# Patient Record
Sex: Male | Born: 1985 | Race: White | Hispanic: No | Marital: Single | Smoking: Current some day smoker
Health system: Southern US, Community
[De-identification: ages and names within clinical notes are randomized; demographics above are authoritative.]

## PROBLEM LIST (undated history)

## (undated) DIAGNOSIS — J189 Pneumonia, unspecified organism: Secondary | ICD-10-CM

## (undated) DIAGNOSIS — T148XXA Other injury of unspecified body region, initial encounter: Secondary | ICD-10-CM

## (undated) DIAGNOSIS — J45909 Unspecified asthma, uncomplicated: Secondary | ICD-10-CM

## (undated) DIAGNOSIS — J3089 Other allergic rhinitis: Secondary | ICD-10-CM

## (undated) HISTORY — PX: WISDOM TOOTH EXTRACTION: SHX21

## (undated) HISTORY — PX: APPENDECTOMY: SHX54

## (undated) HISTORY — DX: Unspecified asthma, uncomplicated: J45.909

---

## 2008-02-20 ENCOUNTER — Ambulatory Visit: Payer: Self-pay | Admitting: Internal Medicine

## 2011-08-04 ENCOUNTER — Ambulatory Visit: Payer: Self-pay

## 2015-09-11 ENCOUNTER — Encounter: Payer: Self-pay | Admitting: Family Medicine

## 2015-09-11 ENCOUNTER — Ambulatory Visit (INDEPENDENT_AMBULATORY_CARE_PROVIDER_SITE_OTHER): Payer: BLUE CROSS/BLUE SHIELD | Admitting: Family Medicine

## 2015-09-11 VITALS — BP 120/70 | HR 60 | Temp 98.0°F | Ht 72.0 in | Wt 125.0 lb

## 2015-09-11 DIAGNOSIS — J0100 Acute maxillary sinusitis, unspecified: Secondary | ICD-10-CM

## 2015-09-11 DIAGNOSIS — J40 Bronchitis, not specified as acute or chronic: Secondary | ICD-10-CM | POA: Diagnosis not present

## 2015-09-11 LAB — POCT INFLUENZA A/B
Influenza A, POC: NEGATIVE
Influenza B, POC: NEGATIVE

## 2015-09-11 MED ORDER — AMOXICILLIN 500 MG PO CAPS: 500.0000 mg | ORAL_CAPSULE | Freq: Three times a day (TID) | ORAL | Status: DC

## 2015-09-11 MED ORDER — GUAIFENESIN-CODEINE 100-10 MG/5ML PO SOLN: 5.0000 mL | Freq: Three times a day (TID) | ORAL | Status: DC | PRN

## 2015-09-11 NOTE — Addendum Note (Signed)
Addended by: Duanne Limerick on: 09/11/2015 04:47 PM   Modules accepted: Orders

## 2015-09-11 NOTE — Progress Notes (Signed)
Name: Anthony Bowman   MRN: [id number]    DOB: 1986-02-24   Date:09/11/2015       Progress Note  Subjective  Chief Complaint  Chief Complaint  Patient presents with  . Sinusitis    cough, cong, fever, chills, night sweats, SOB    Sinusitis This is a new problem. The current episode started in the past 7 days. The problem has been waxing and waning since onset. The maximum temperature recorded prior to his arrival was 100.4 - 100.9 F. The fever has been present for 1 to 2 days. Associated symptoms include chills, congestion, coughing, diaphoresis, headaches, sinus pressure, sneezing and a sore throat. Pertinent negatives include no ear pain, neck pain, shortness of breath or swollen glands. Past treatments include nothing. The treatment provided mild relief.  Cough This is a new problem. The current episode started in the past 7 days. The problem has been gradually improving. The cough is productive of purulent sputum (yellow-green). Associated symptoms include chills, headaches and a sore throat. Pertinent negatives include no chest pain, ear pain, fever, heartburn, myalgias, rash, shortness of breath, weight loss or wheezing. The symptoms are aggravated by cold air. The treatment provided no relief. There is no history of environmental allergies.    No problem-specific assessment & plan notes found for this encounter.   Past Medical History  Diagnosis Date  . Asthma     History reviewed. No pertinent past surgical history.  History reviewed. No pertinent family history.  Social History   Social History  . Marital Status: Married    Spouse Name: N/A  . Number of Children: N/A  . Years of Education: N/A   Occupational History  . Not on file.   Social History Main Topics  . Smoking status: Current Every Day Smoker  . Smokeless tobacco: Not on file  . Alcohol Use: 0.0 oz/week    0 Standard drinks or equivalent per week  . Drug Use: No  . Sexual Activity: Yes   Other  Topics Concern  . Not on file   Social History Narrative  . No narrative on file    No Known Allergies   Review of Systems  Constitutional: Positive for chills and diaphoresis. Negative for fever, weight loss and malaise/fatigue.  HENT: Positive for congestion, sinus pressure, sneezing and sore throat. Negative for ear discharge and ear pain.   Eyes: Negative for blurred vision.  Respiratory: Positive for cough. Negative for sputum production, shortness of breath and wheezing.   Cardiovascular: Negative for chest pain, palpitations and leg swelling.  Gastrointestinal: Negative for heartburn, nausea, abdominal pain, diarrhea, constipation, blood in stool and melena.  Genitourinary: Negative for dysuria, urgency, frequency and hematuria.  Musculoskeletal: Negative for myalgias, back pain, joint pain and neck pain.  Skin: Negative for rash.  Neurological: Positive for headaches. Negative for dizziness, tingling, sensory change and focal weakness.  Endo/Heme/Allergies: Negative for environmental allergies and polydipsia. Does not bruise/bleed easily.  Psychiatric/Behavioral: Negative for depression and suicidal ideas. The patient is not nervous/anxious and does not have insomnia.      Objective  Filed Vitals:   09/11/15 1618  BP: 120/70  Pulse: 60  Temp: 98 F (36.7 C)  TempSrc: Oral  Height: 6' (1.829 m)  Weight: 125 lb (56.7 kg)    Physical Exam  Constitutional: He is oriented to person, place, and time and well-developed, well-nourished, and in no distress.  HENT:  Head: Normocephalic.  Right Ear: Tympanic membrane and external ear normal.  Left Ear: Tympanic membrane and external ear normal.  Nose: Nose normal.  Mouth/Throat: Oropharynx is clear and moist.  Eyes: Conjunctivae and EOM are normal. Pupils are equal, round, and reactive to light. Right eye exhibits no discharge. Left eye exhibits no discharge. No scleral icterus.  Neck: Normal range of motion. Neck supple.  No JVD present. No tracheal deviation present. No thyromegaly present.  Cardiovascular: Normal rate, regular rhythm, normal heart sounds and intact distal pulses.  Exam reveals no gallop and no friction rub.   No murmur heard. Pulmonary/Chest: Breath sounds normal. No respiratory distress. He has no wheezes. He has no rales.  Abdominal: Soft. Bowel sounds are normal. He exhibits no mass. There is no hepatosplenomegaly. There is no tenderness. There is no rebound, no guarding and no CVA tenderness.  Musculoskeletal: Normal range of motion. He exhibits no edema or tenderness.  Lymphadenopathy:    He has no cervical adenopathy.  Neurological: He is alert and oriented to person, place, and time. He has normal sensation, normal strength and intact cranial nerves. No cranial nerve deficit.  Skin: Skin is warm. No rash noted.  Psychiatric: Mood and affect normal.      Assessment & Plan  Problem List Items Addressed This Visit    None    Visit Diagnoses    Acute maxillary sinusitis, recurrence not specified    -  Primary    Relevant Medications    amoxicillin (AMOXIL) 500 MG capsule    guaiFENesin-codeine 100-10 MG/5ML syrup    Bronchitis        Relevant Medications    amoxicillin (AMOXIL) 500 MG capsule    guaiFENesin-codeine 100-10 MG/5ML syrup         Dr. Hayden [facility]  09/11/2015

## 2016-03-05 ENCOUNTER — Encounter: Payer: Self-pay | Admitting: *Deleted

## 2016-03-05 ENCOUNTER — Emergency Department
Admission: EM | Admit: 2016-03-05 | Discharge: 2016-03-05 | Disposition: A | Discharge status: Home / Self Care | Payer: Self-pay | Attending: Emergency Medicine | Admitting: Emergency Medicine

## 2016-03-05 DIAGNOSIS — J45909 Unspecified asthma, uncomplicated: Secondary | ICD-10-CM | POA: Insufficient documentation

## 2016-03-05 DIAGNOSIS — Y929 Unspecified place or not applicable: Secondary | ICD-10-CM | POA: Insufficient documentation

## 2016-03-05 DIAGNOSIS — Y93F2 Activity, caregiving, lifting: Secondary | ICD-10-CM | POA: Insufficient documentation

## 2016-03-05 DIAGNOSIS — X501XXA Overexertion from prolonged static or awkward postures, initial encounter: Secondary | ICD-10-CM | POA: Insufficient documentation

## 2016-03-05 DIAGNOSIS — X500XXA Overexertion from strenuous movement or load, initial encounter: Secondary | ICD-10-CM | POA: Insufficient documentation

## 2016-03-05 DIAGNOSIS — Y999 Unspecified external cause status: Secondary | ICD-10-CM | POA: Insufficient documentation

## 2016-03-05 DIAGNOSIS — Z792 Long term (current) use of antibiotics: Secondary | ICD-10-CM | POA: Insufficient documentation

## 2016-03-05 DIAGNOSIS — S39012A Strain of muscle, fascia and tendon of lower back, initial encounter: Secondary | ICD-10-CM | POA: Insufficient documentation

## 2016-03-05 DIAGNOSIS — F17200 Nicotine dependence, unspecified, uncomplicated: Secondary | ICD-10-CM | POA: Insufficient documentation

## 2016-03-05 MED ORDER — OXYCODONE-ACETAMINOPHEN 5-325 MG PO TABS
1.0000 | ORAL_TABLET | ORAL | Status: DC | PRN
  Administered 2016-03-05: 1 via ORAL

## 2016-03-05 MED ORDER — OXYCODONE-ACETAMINOPHEN 5-325 MG PO TABS: 1.0000 | ORAL_TABLET | Freq: Four times a day (QID) | ORAL | 0 refills | Status: DC | PRN

## 2016-03-05 MED ORDER — OXYCODONE-ACETAMINOPHEN 5-325 MG PO TABS
ORAL_TABLET | ORAL | Status: DC
Start: 2016-03-05 — End: 2016-03-05
  Filled 2016-03-05: qty 1

## 2016-03-05 MED ORDER — METHOCARBAMOL 500 MG PO TABS
1000.0000 mg | ORAL_TABLET | Freq: Once | ORAL | Status: AC
  Administered 2016-03-05: 1000 mg via ORAL
  Filled 2016-03-05: qty 2

## 2016-03-05 MED ORDER — METHOCARBAMOL 750 MG PO TABS: 750.0000 mg | ORAL_TABLET | Freq: Four times a day (QID) | ORAL | 0 refills | Status: DC

## 2016-03-05 NOTE — ED Triage Notes (Signed)
Pt c/o on-going back pain x 1 week, worse w/ bending and exertion. Pt denies relief from OTC meds and repositioning. Pt denies injury, but states he does some lifting and bending at work. Pt unable to go to work today due to pain this morning.

## 2016-03-05 NOTE — ED Provider Notes (Signed)
[facility] Emergency Department Provider Note   ____________________________________________   First MD Initiated Contact with Patient 03/05/16 1944     (approximate)  I have reviewed the triage vital signs and the nursing notes.   HISTORY  Chief Complaint Back Pain    HPI Anthony Bowman is a 30 y.o. male patient complaining of mid back pain for 1 week. Patient stated pain increases with bending and lateral movements. 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 Patient denies any radicular component to this pain. Patient denies any bladder or bowel dysfunction. Patient said over-the-counter anti-inflammatories are not helping. Patient rates his pain as a 7/10. Patient described the pain as "spasmatic and sharp".   Past Medical History:  Diagnosis Date  . Asthma     There are no active problems to display for this patient.   History reviewed. No pertinent surgical history.  Prior to Admission medications   Medication Sig Start Date End Date Taking? Authorizing Provider  albuterol (PROVENTIL HFA;VENTOLIN HFA) 108 (90 Base) MCG/ACT inhaler Inhale 2 puffs into the lungs every 6 (six) hours as needed for wheezing or shortness of breath.    Historical Provider, MD  amoxicillin (AMOXIL) 500 MG capsule Take 1 capsule (500 mg total) by mouth 3 (three) times daily. 09/11/15   Duanne Limerickeanna C Jones, MD  guaiFENesin-codeine 100-10 MG/5ML syrup Take 5 mLs by mouth 3 (three) times daily as needed. 09/11/15   Duanne Limerickeanna C Jones, MD    Allergies Review of patient's allergies indicates no known allergies.  History reviewed. No pertinent family history.  Social History Social History  Substance Use Topics  . Smoking status: Current Every Day Smoker  . Smokeless tobacco: Never Used  . Alcohol use 0.0 oz/week    Review of Systems Constitutional: No fever/chills Eyes: No visual changes. ENT: No sore throat. Cardiovascular: Denies chest  pain. Respiratory: Denies shortness of breath. Gastrointestinal: No abdominal pain.  No nausea, no vomiting.  No diarrhea.  No constipation. Genitourinary: Negative for dysuria. Musculoskeletal: Positive for back pain. Skin: Negative for rash. Neurological: Negative for headaches, focal weakness or numbness.    ____________________________________________   PHYSICAL EXAM:  VITAL SIGNS: ED Triage Vitals  Enc Vitals Group     BP 03/05/16 1921 (!) 140/103     Pulse Rate 03/05/16 1921 (!) 110     Resp 03/05/16 1921 18     Temp 03/05/16 1921 98.5 F (36.9 C)     Temp Source 03/05/16 1921 Oral     SpO2 03/05/16 1921 98 %     Weight 03/05/16 1922 145 lb (65.8 kg)     Height 03/05/16 1922 6' (1.829 m)     Head Circumference --      Peak Flow --      Pain Score 03/05/16 1922 7     Pain Loc --      Pain Edu? --      Excl. in GC? --     Constitutional: Alert and oriented. Well appearing and in no acute distress. Eyes: Conjunctivae are normal. PERRL. EOMI. Head: Atraumatic. Nose: No congestion/rhinnorhea. Mouth/Throat: Mucous membranes are moist.  Oropharynx non-erythematous. Neck: No stridor.  No cervical spine tenderness to palpation. Hematological/Lymphatic/Immunilogical: No cervical lymphadenopathy. Cardiovascular: Normal rate, regular rhythm. Grossly normal heart sounds.  Good peripheral circulation. Respiratory: Normal respiratory effort.  No retractions. Lungs CTAB. Gastrointestinal: Soft and nontender. No distention. No abdominal bruits. No CVA tenderness. Musculoskeletal: No obvious spinal deformity. Patient has some  moderate guarding with bilateral paraspinal muscles. Patient has increased muscle spasm with bilateral and flexion movements. Patient is a Straight leg test.  Neurologic:  Normal speech and language. No gross focal neurologic deficits are appreciated. No gait instability. Skin:  Skin is warm, dry and intact. No rash noted. Psychiatric: Mood and affect are  normal. Speech and behavior are normal.  ____________________________________________   LABS (all labs ordered are listed, but only abnormal results are displayed)  Labs Reviewed - No data to display ____________________________________________  EKG   ____________________________________________  RADIOLOGY   ____________________________________________   PROCEDURES  Procedure(s) performed: None  Procedures  Critical Care performed: No  ____________________________________________   INITIAL IMPRESSION / ASSESSMENT AND PLAN / ED COURSE  Pertinent labs & imaging results that were available during my care of the patient were reviewed by me and considered in my medical decision making (see chart for details). Lumbar strain. Patient given discharge Instructions. Patient given a prescription for Percocet and Robaxin. Patient given a work note for 2 days. Patient advised to follow-up  Clinical Course   Patient given Percocet at triage.  ____________________________________________   FINAL CLINICAL IMPRESSION(S) / ED DIAGNOSES  Final diagnoses:  Lumbar strain, initial encounter      NEW MEDICATIONS STARTED DURING THIS VISIT:  New Prescriptions   No medications on file     Note:  This document was prepared using Dragon voice recognition software and may include unintentional dictation errors.    Joni ReiningRonald K Johonna Binette, PA-C 03/05/16 1955    Sharman CheekPhillip Stafford, MD 03/05/16 [phone]

## 2016-03-05 NOTE — ED Notes (Signed)
Reviewed d/c instructions, follow-up care, use of ice with pt. Pt verbalized understanding.

## 2016-03-13 ENCOUNTER — Encounter: Payer: Self-pay | Admitting: Emergency Medicine

## 2016-03-13 ENCOUNTER — Emergency Department
Admission: EM | Admit: 2016-03-13 | Discharge: 2016-03-13 | Disposition: A | Discharge status: Home / Self Care | Payer: Self-pay | Attending: Emergency Medicine | Admitting: Emergency Medicine

## 2016-03-13 ENCOUNTER — Emergency Department: Payer: Self-pay

## 2016-03-13 DIAGNOSIS — Z792 Long term (current) use of antibiotics: Secondary | ICD-10-CM | POA: Insufficient documentation

## 2016-03-13 DIAGNOSIS — F17200 Nicotine dependence, unspecified, uncomplicated: Secondary | ICD-10-CM | POA: Insufficient documentation

## 2016-03-13 DIAGNOSIS — J45909 Unspecified asthma, uncomplicated: Secondary | ICD-10-CM | POA: Insufficient documentation

## 2016-03-13 DIAGNOSIS — M545 Low back pain, unspecified: Secondary | ICD-10-CM

## 2016-03-13 DIAGNOSIS — Z791 Long term (current) use of non-steroidal anti-inflammatories (NSAID): Secondary | ICD-10-CM | POA: Insufficient documentation

## 2016-03-13 DIAGNOSIS — Z79899 Other long term (current) drug therapy: Secondary | ICD-10-CM | POA: Insufficient documentation

## 2016-03-13 LAB — URINALYSIS COMPLETE WITH MICROSCOPIC (ARMC ONLY)
BACTERIA UA: NONE SEEN
Bilirubin Urine: NEGATIVE
Glucose, UA: NEGATIVE mg/dL
Hgb urine dipstick: NEGATIVE
KETONES UR: NEGATIVE mg/dL
Leukocytes, UA: NEGATIVE
Nitrite: NEGATIVE
PH: 5 (ref 5.0–8.0)
PROTEIN: 30 mg/dL — AB
RBC / HPF: NONE SEEN RBC/hpf (ref 0–5)
SQUAMOUS EPITHELIAL / LPF: NONE SEEN
Specific Gravity, Urine: 1.029 (ref 1.005–1.030)
WBC, UA: NONE SEEN WBC/hpf (ref 0–5)

## 2016-03-13 MED ORDER — NAPROXEN 500 MG PO TABS: 500.0000 mg | ORAL_TABLET | Freq: Two times a day (BID) | ORAL | 0 refills | Status: DC

## 2016-03-13 NOTE — ED Triage Notes (Signed)
Patient states that he was seen last week for back pain. Patient was told to follow up if the pain was not getting. Patient states that he was unable to get in with his doctor this week because of his work schedule. Patient states that the pain in his lower back is constant, has had a few episodes of numbness in the right leg. Patient states that his leg is not currently numb.

## 2016-03-13 NOTE — ED Provider Notes (Signed)
[facility] Emergency Department Provider Note   ____________________________________________   First MD Initiated Contact with Patient 03/13/16 1631     (approximate)  I have reviewed the triage vital signs and the nursing notes.   HISTORY  Chief Complaint Back Pain   HPI Anthony Bowman is a 30 y.o. male is here with complaint of low back pain. Pain is localized mostly on the right side. Patient was seen in the emergency room on 03/05/2016 at which time he was prescribed Percocet and Robaxin. Patient did not follow-up with his primary care doctor due to his work schedule. He is back again today with complaint of continued back pain. He denies any paresthesias into his legs. He denies any incontinence of bowel or bladder. He continues to be ambulatory without assistance. He states that his back has "never been x-rayed". He is unaware of any history of kidney stones. He denies any nausea or vomiting. Currently he rates his pain as a 6/10.   Past Medical History:  Diagnosis Date  . Asthma     There are no active problems to display for this patient.   History reviewed. No pertinent surgical history.  Prior to Admission medications   Medication Sig Start Date End Date Taking? Authorizing Provider  albuterol (PROVENTIL HFA;VENTOLIN HFA) 108 (90 Base) MCG/ACT inhaler Inhale 2 puffs into the lungs every 6 (six) hours as needed for wheezing or shortness of breath.    Historical Provider, MD  amoxicillin (AMOXIL) 500 MG capsule Take 1 capsule (500 mg total) by mouth 3 (three) times daily. 09/11/15   Duanne Limerickeanna C Jones, MD  guaiFENesin-codeine 100-10 MG/5ML syrup Take 5 mLs by mouth 3 (three) times daily as needed. 09/11/15   Duanne Limerickeanna C Jones, MD  methocarbamol (ROBAXIN-750) 750 MG tablet Take 1 tablet (750 mg total) by mouth 4 (four) times daily. 03/05/16   Joni Reiningonald K Smith, PA-C  naproxen (NAPROSYN) 500 MG tablet Take 1 tablet (500 mg total) by mouth 2 (two) times daily  with a meal. 03/13/16   Tommi Rumpshonda L Summers, PA-C  oxyCODONE-acetaminophen (ROXICET) 5-325 MG tablet Take 1 tablet by mouth every 6 (six) hours as needed for moderate pain. 03/05/16   Joni Reiningonald K Smith, PA-C    Allergies Review of patient's allergies indicates no known allergies.  No family history on file.  Social History Social History  Substance Use Topics  . Smoking status: Current Every Day Smoker  . Smokeless tobacco: Never Used  . Alcohol use 0.0 oz/week    Review of Systems Constitutional: No fever/chills Cardiovascular: Denies chest pain. Respiratory: Denies shortness of breath. Gastrointestinal: No abdominal pain.  No nausea, no vomiting.  No diarrhea.  No constipation. Genitourinary: Negative for dysuria. Musculoskeletal: Positive for back pain. Skin: Negative for rash. Neurological: Negative for headaches, focal weakness or numbness.  10-point ROS otherwise negative.  ____________________________________________   PHYSICAL EXAM:  VITAL SIGNS: ED Triage Vitals  Enc Vitals Group     BP 03/13/16 1626 128/87     Pulse Rate 03/13/16 1626 70     Resp 03/13/16 1626 16     Temp 03/13/16 1626 98.1 F (36.7 C)     Temp Source 03/13/16 1626 Oral     SpO2 03/13/16 1626 100 %     Weight 03/13/16 1627 150 lb (68 kg)     Height 03/13/16 1627 6' (1.829 m)     Head Circumference --      Peak Flow --  Pain Score 03/13/16 1625 6     Pain Loc --      Pain Edu? --      Excl. in GC? --     Constitutional: Alert and oriented. Well appearing and in no acute distress. Eyes: Conjunctivae are normal. PERRL. EOMI. Head: Atraumatic. Nose: No congestion/rhinnorhea. Neck: No stridor.   Cardiovascular: Normal rate, regular rhythm. Grossly normal heart sounds.  Good peripheral circulation. Respiratory: Normal respiratory effort.  No retractions. Lungs CTAB. Gastrointestinal: Soft and nontender. No distention. No CVA tenderness. Musculoskeletal: On examination of the back there is  no gross deformity. Patient has a very small skeletal frame. There is moderate tenderness on palpation of the right SI joint area as well as L5-S1 area. Range of motion is slightly restricted secondary discomfort but no actual muscle spasms were seen. Straight leg raises were approximately 70 bilaterally with minimal discomfort. Normal gait was noted. Neurologic:  Normal speech and language. No gross focal neurologic deficits are appreciated. No gait instability. Skin:  Skin is warm, dry and intact. No rash noted. Psychiatric: Mood and affect are normal. Speech and behavior are normal.  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   I, Tommi Rumps, personally viewed and evaluated these images (plain radiographs) as part of my medical decision making, as well as reviewing the written report by the radiologist. ____________________________________________   PROCEDURES  Procedure(s) performed: None  Procedures  Critical Care performed: No  ____________________________________________   INITIAL IMPRESSION / ASSESSMENT AND PLAN / ED COURSE  Pertinent labs & imaging results that were available during my care of the patient were reviewed by me and considered in my medical decision making (see chart for details).    Clinical Course   Patient was given information about his back x-ray. He was given a prescription for naproxen 5 mg twice a day with food. He is to follow-up with Dr. Martha Clan about his continued back  pain.  ____________________________________________   FINAL CLINICAL IMPRESSION(S) / ED DIAGNOSES  Final diagnoses:  Right-sided low back pain without sciatica      NEW MEDICATIONS STARTED DURING THIS VISIT:  New Prescriptions   NAPROXEN (NAPROSYN) 500 MG TABLET    Take 1 tablet (500 mg total) by mouth 2 (two) times daily with a meal.     Note:  This document was prepared using Dragon voice recognition software and may include unintentional dictation errors.    Tommi Rumps, PA-C 03/13/16 1806    Nita Sickle, MD 03/14/16 1134

## 2016-03-13 NOTE — Discharge Instructions (Signed)
Begin taking naproxen 500 mg twice a day with food. Call to make an appointment with Dr. Martha ClanKrasinski who is the orthopedist on call today. Ice or heat to you back as needed for inflammation and discomfort.

## 2016-04-18 ENCOUNTER — Emergency Department (HOSPITAL_COMMUNITY)
Admission: EM | Admit: 2016-04-18 | Discharge: 2016-04-19 | Disposition: A | Discharge status: Home / Self Care | Payer: Self-pay | Attending: Emergency Medicine | Admitting: Emergency Medicine

## 2016-04-18 ENCOUNTER — Emergency Department (HOSPITAL_COMMUNITY): Payer: Self-pay

## 2016-04-18 ENCOUNTER — Encounter (HOSPITAL_COMMUNITY): Payer: Self-pay | Admitting: Emergency Medicine

## 2016-04-18 DIAGNOSIS — J45909 Unspecified asthma, uncomplicated: Secondary | ICD-10-CM | POA: Insufficient documentation

## 2016-04-18 DIAGNOSIS — Y939 Activity, unspecified: Secondary | ICD-10-CM | POA: Insufficient documentation

## 2016-04-18 DIAGNOSIS — S56921A Laceration of unspecified muscles, fascia and tendons at forearm level, right arm, initial encounter: Secondary | ICD-10-CM

## 2016-04-18 DIAGNOSIS — Y929 Unspecified place or not applicable: Secondary | ICD-10-CM | POA: Insufficient documentation

## 2016-04-18 DIAGNOSIS — S61421A Laceration with foreign body of right hand, initial encounter: Secondary | ICD-10-CM | POA: Insufficient documentation

## 2016-04-18 DIAGNOSIS — Z87891 Personal history of nicotine dependence: Secondary | ICD-10-CM | POA: Insufficient documentation

## 2016-04-18 DIAGNOSIS — Y999 Unspecified external cause status: Secondary | ICD-10-CM | POA: Insufficient documentation

## 2016-04-18 DIAGNOSIS — S51811A Laceration without foreign body of right forearm, initial encounter: Secondary | ICD-10-CM | POA: Insufficient documentation

## 2016-04-18 DIAGNOSIS — W25XXXA Contact with sharp glass, initial encounter: Secondary | ICD-10-CM | POA: Insufficient documentation

## 2016-04-18 MED ORDER — MORPHINE SULFATE (PF) 4 MG/ML IV SOLN
8.0000 mg | Freq: Once | INTRAVENOUS | Status: AC
Start: 2016-04-18 — End: 2016-04-18
  Administered 2016-04-18: 8 mg via INTRAVENOUS
  Filled 2016-04-18: qty 2

## 2016-04-18 MED ORDER — MORPHINE SULFATE (PF) 4 MG/ML IV SOLN
8.0000 mg | Freq: Once | INTRAVENOUS | Status: AC
  Administered 2016-04-19: 8 mg via INTRAVENOUS
  Filled 2016-04-18: qty 2

## 2016-04-18 MED ORDER — LIDOCAINE-EPINEPHRINE (PF) 2 %-1:200000 IJ SOLN
20.0000 mL | Freq: Once | INTRAMUSCULAR | Status: DC
  Filled 2016-04-18: qty 20

## 2016-04-18 NOTE — ED Provider Notes (Signed)
[facility] DEPT Provider Note   CSN: [id number] Arrival date & time: 04/18/16  2219     History   Chief Complaint Chief Complaint  Patient presents with  . Extremity Laceration    HPI Anthony Bowman is a 30 y.o. male.   Laceration   The incident occurred less than 1 hour ago. The laceration is located on the right hand and right arm. Size: 2cm near elbow, 3 cm to midarm, 5 cm to right hand. The laceration mechanism was a broken glass. The pain is moderate. The pain has been constant since onset. Possible foreign bodies include glass. His tetanus status is UTD.    Past Medical History:  Diagnosis Date  . Asthma     There are no active problems to display for this patient.   History reviewed. No pertinent surgical history.     Home Medications    Prior to Admission medications   Medication Sig Start Date End Date Taking? Authorizing Provider  albuterol (PROVENTIL HFA;VENTOLIN HFA) 108 (90 Base) MCG/ACT inhaler Inhale 2 puffs into the lungs every 6 (six) hours as needed for wheezing or shortness of breath.    Historical Provider, MD  amoxicillin (AMOXIL) 500 MG capsule Take 1 capsule (500 mg total) by mouth 3 (three) times daily. 09/11/15   Duanne Limerickeanna C Jones, MD  guaiFENesin-codeine 100-10 MG/5ML syrup Take 5 mLs by mouth 3 (three) times daily as needed. 09/11/15   Duanne Limerickeanna C Jones, MD  methocarbamol (ROBAXIN-750) 750 MG tablet Take 1 tablet (750 mg total) by mouth 4 (four) times daily. 03/05/16   Joni Reiningonald K Smith, PA-C  naproxen (NAPROSYN) 500 MG tablet Take 1 tablet (500 mg total) by mouth 2 (two) times daily with a meal. 03/13/16   Tommi Rumpshonda L Summers, PA-C  oxyCODONE-acetaminophen (ROXICET) 5-325 MG tablet Take 1 tablet by mouth every 6 (six) hours as needed for moderate pain. 03/05/16   Joni Reiningonald K Smith, PA-C    Family History History reviewed. No pertinent family history.  Social History Social History  Substance Use Topics  . Smoking status: Former Smoker    Quit date:  03/19/2016  . Smokeless tobacco: Never Used  . Alcohol use 0.0 oz/week     Allergies   Review of patient's allergies indicates no known allergies.   Review of Systems Review of Systems  Constitutional: Negative for chills and fever.  HENT: Negative for ear pain and sore throat.   Eyes: Negative for pain and visual disturbance.  Respiratory: Negative for cough and shortness of breath.   Cardiovascular: Negative for chest pain and palpitations.  Gastrointestinal: Negative for abdominal pain and vomiting.  Genitourinary: Negative for dysuria and hematuria.  Musculoskeletal: Negative for arthralgias and back pain.  Skin: Positive for wound. Negative for color change and rash.  Neurological: Negative for seizures and syncope.  All other systems reviewed and are negative.    Physical Exam Updated Vital Signs BP (!) 143/101 (BP Location: Right Arm)   Pulse 89   Temp 98.8 F (37.1 C) (Oral)   Resp 22   Ht 6' (1.829 m)   Wt 68 kg   SpO2 100%   BMI 20.34 kg/m   Physical Exam  Constitutional: He is oriented to person, place, and time. He appears well-developed and well-nourished.  HENT:  Head: Normocephalic and atraumatic.  Eyes: Conjunctivae and EOM are normal. Pupils are equal, round, and reactive to light.  Neck: Normal range of motion. Neck supple.  Cardiovascular: Normal rate and regular rhythm.  Pulmonary/Chest: Effort normal and breath sounds normal.  Abdominal: Soft. There is no tenderness.  Neurological: He is alert and oriented to person, place, and time.  Skin: Skin is warm and dry.  Right arm and hand lacerations: 2cm near elbow, 3 cm to midarm, 5 cm to right hand. Neurovascularly intact.  Able to move digits.  Psychiatric: He has a normal mood and affect.  Nursing note and vitals reviewed.    ED Treatments / Results  Labs (all labs ordered are listed, but only abnormal results are displayed) Labs Reviewed - No data to display  EKG  EKG  Interpretation None       Radiology No results found.  Procedures .Marland KitchenLaceration Repair Date/Time: 04/19/2016 2:09 AM Performed by: Garey Ham Authorized by: Melene Plan   Consent:    Consent obtained:  Verbal   Consent given by:  Patient   Risks discussed:  Pain, tendon damage, vascular damage, nerve damage and need for additional repair   Alternatives discussed:  Delayed treatment and no treatment Anesthesia (see MAR for exact dosages):    Anesthesia method:  Local infiltration   Local anesthetic:  Lidocaine 2% WITH epi Laceration details:    Location:  Hand   Hand location:  R hand, dorsum   Length (cm):  6   Depth (mm):  15 Repair type:    Repair type:  Complex Pre-procedure details:    Preparation:  Imaging obtained to evaluate for foreign bodies and patient was prepped and draped in usual sterile fashion Exploration:    Limited defect created (wound extended): no     Hemostasis achieved with:  Tied off vessels, tourniquet and direct pressure   Wound exploration: entire depth of wound probed and visualized     Wound extent: foreign bodies/material (glass removed), tendon damage (partial to 5th digit) and vascular damage     Tendon damage location:  Upper extremity   Upper extremity tendon damage location:  Finger extensor   Finger extensor tendon:  Extensor digiti minimi   Tendon damage extent:  Partial transection   Tendon repair plan:  Refer for evaluation Treatment:    Wound cleansed with: chloraprep.   Amount of cleaning:  Extensive   Irrigation solution:  Sterile saline   Irrigation volume:  1L   Irrigation method:  Syringe   Visualized foreign bodies/material removed: yes     Debridement:  Minimal   Undermining:  None   Scar revision: no   Skin repair:    Repair method:  Sutures   Suture size:  4-0   Suture material:  Prolene   Suture technique:  Simple interrupted   Number of sutures:  27 Approximation:    Approximation:  Close Post-procedure  details:    Dressing:  Splint for protection and bulky dressing   Patient tolerance of procedure:  Tolerated well, no immediate complications    (including critical care time)  Medications Ordered in ED Medications  lidocaine-EPINEPHrine (XYLOCAINE W/EPI) 2 %-1:200000 (PF) injection 20 mL (not administered)  morphine 4 MG/ML injection 8 mg (8 mg Intravenous Given 04/18/16 2240)  morphine 4 MG/ML injection 8 mg (8 mg Intravenous Given 04/19/16 0002)     Initial Impression / Assessment and Plan / ED Course  I have reviewed the triage vital signs and the nursing notes.  Pertinent labs & imaging results that were available during my care of the patient were reviewed by me and considered in my medical decision making (see chart for details).  Clinical Course  IV medications given for pain control.  Patient lacerations repairs as above.  Arterial bleeding encountered and vessel ligated. Tourniquet applied.  Concern for partial extensor tendon injury.  Patient referred to hand surgery for further evaluation.  Patient discharged with return precautions, follow up instructions, and educational materials.  After the patient was discharged, he was contacted at home for follow up and to coordinate a prescription for Keflex.  He will pick up a phone-in prescription to the [facility] in [place].  Kefflex 500mg  QID for 7 days.  Final Clinical Impressions(s) / ED Diagnoses   Final diagnoses:  Laceration of right hand with foreign body, initial encounter  Forearm laceration involving tendon, right, initial encounter    New Prescriptions New Prescriptions   HYDROCODONE-ACETAMINOPHEN (NORCO/VICODIN) 5-325 MG TABLET    Take 1-2 tablets by mouth every 4 (four) hours as needed.     Garey Ham, MD 04/19/16 1478    Garey Ham, MD 04/19/16 1111    Melene Plan, DO 04/19/16 2052

## 2016-04-18 NOTE — ED Triage Notes (Signed)
Pt presents from home with [facility] for multiple lacerations to RIGHT hand and forearm; pt states he was attempting to prevent a glass door from closing and arm went through glass; pt became hypotensive upon sight of blood (BPs were 88/50 and 90/60); EMS gave 500cc NS and BP now 143/93; pt reports injury occurred approx 2130 tonight; pt says pain is throbbing, tingling, burning pain

## 2016-04-18 NOTE — ED Notes (Signed)
Pt transported to xray 

## 2016-04-19 MED ORDER — HYDROCODONE-ACETAMINOPHEN 5-325 MG PO TABS: 1.0000 | ORAL_TABLET | ORAL | 0 refills | Status: DC | PRN

## 2016-04-19 MED ORDER — HYDROCODONE-ACETAMINOPHEN 5-325 MG PO TABS
2.0000 | ORAL_TABLET | Freq: Once | ORAL | Status: AC
  Administered 2016-04-19: 2 via ORAL
  Filled 2016-04-19: qty 2

## 2016-04-19 NOTE — ED Notes (Signed)
Patient verbalized understanding of discharge instructions and denies any further needs or questions at this time. VS stable. Patient ambulatory with steady gait, escorted to ED entrance in wheelchair.   

## 2016-04-19 NOTE — Progress Notes (Signed)
[facility]'s girlfriend Victorino DikeJennifer reporting that antibiotic was supposed to be called into [facility] on [address] [place], however, it was not there.  Patient's girlfriend reports she would also like to know if patient can loosen up bandage as it is hurting him.  EDCM spoke to Resident Jaquita RectorMichael Dean who reports he has called a prescription for Keflex 500mg  1 po qid for 7 days this am for patient.  He also reports it is okay for patient to loosen his bandage.  EDCM called [facility] and [facility] who reports prescription was left on the voice mail and is currently filled and ready for pick up.  EDCM called and informed patient's girlfriend Victorino DikeJennifer of the above information.

## 2016-04-27 ENCOUNTER — Encounter (HOSPITAL_COMMUNITY): Payer: Self-pay | Admitting: *Deleted

## 2016-04-27 NOTE — Progress Notes (Signed)
Pt denies SOB, chest pain, and being under the care of a cardiologist. Pt denies having a stress test, echo and cardiac cath. Pt denies having a chest x ray and EKG within the last year. Pt denies having any recent labs. Pt made aware to stop taking Aspirin, vitamins, fish oil and herbal medications. Do not take any NSAIDs ie: Ibuprofen, Advil, Naproxen, BC and Goody Powder or any medication containing Aspirin. Pt verbalized understanding of all pre-op instructions.

## 2016-04-28 ENCOUNTER — Encounter (HOSPITAL_COMMUNITY)
Admission: RE | Disposition: A | Discharge status: Home / Self Care | Payer: Self-pay | Source: Ambulatory Visit | Attending: Orthopedic Surgery

## 2016-04-28 ENCOUNTER — Ambulatory Visit (HOSPITAL_COMMUNITY): Payer: Self-pay | Admitting: Certified Registered Nurse Anesthetist

## 2016-04-28 ENCOUNTER — Ambulatory Visit (HOSPITAL_COMMUNITY)
Admission: RE | Admit: 2016-04-28 | Discharge: 2016-04-28 | Disposition: A | Discharge status: Home / Self Care | Payer: Self-pay | Source: Ambulatory Visit | Attending: Orthopedic Surgery | Admitting: Orthopedic Surgery

## 2016-04-28 ENCOUNTER — Encounter (HOSPITAL_COMMUNITY): Payer: Self-pay | Admitting: General Practice

## 2016-04-28 DIAGNOSIS — S66526A Laceration of intrinsic muscle, fascia and tendon of right little finger at wrist and hand level, initial encounter: Secondary | ICD-10-CM | POA: Insufficient documentation

## 2016-04-28 DIAGNOSIS — W458XXA Other foreign body or object entering through skin, initial encounter: Secondary | ICD-10-CM | POA: Insufficient documentation

## 2016-04-28 DIAGNOSIS — W25XXXA Contact with sharp glass, initial encounter: Secondary | ICD-10-CM | POA: Insufficient documentation

## 2016-04-28 DIAGNOSIS — Z87891 Personal history of nicotine dependence: Secondary | ICD-10-CM | POA: Insufficient documentation

## 2016-04-28 DIAGNOSIS — Z1881 Retained glass fragments: Secondary | ICD-10-CM | POA: Insufficient documentation

## 2016-04-28 DIAGNOSIS — J45909 Unspecified asthma, uncomplicated: Secondary | ICD-10-CM | POA: Insufficient documentation

## 2016-04-28 DIAGNOSIS — S61421A Laceration with foreign body of right hand, initial encounter: Secondary | ICD-10-CM | POA: Insufficient documentation

## 2016-04-28 DIAGNOSIS — S51821A Laceration with foreign body of right forearm, initial encounter: Secondary | ICD-10-CM | POA: Insufficient documentation

## 2016-04-28 DIAGNOSIS — Z792 Long term (current) use of antibiotics: Secondary | ICD-10-CM | POA: Insufficient documentation

## 2016-04-28 HISTORY — DX: Pneumonia, unspecified organism: J18.9

## 2016-04-28 HISTORY — PX: WOUND EXPLORATION: SHX6188

## 2016-04-28 HISTORY — DX: Other allergic rhinitis: J30.89

## 2016-04-28 HISTORY — DX: Other injury of unspecified body region, initial encounter: T14.8XXA

## 2016-04-28 LAB — CBC
HCT: 37.3 % — ABNORMAL LOW (ref 39.0–52.0)
Hemoglobin: 12.8 g/dL — ABNORMAL LOW (ref 13.0–17.0)
MCH: 30.5 pg (ref 26.0–34.0)
MCHC: 34.3 g/dL (ref 30.0–36.0)
MCV: 88.8 fL (ref 78.0–100.0)
Platelets: 247 10*3/uL (ref 150–400)
RBC: 4.2 MIL/uL — ABNORMAL LOW (ref 4.22–5.81)
RDW: 12.9 % (ref 11.5–15.5)
WBC: 7.2 10*3/uL (ref 4.0–10.5)

## 2016-04-28 SURGERY — WOUND EXPLORATION
Anesthesia: General | Laterality: Right

## 2016-04-28 MED ORDER — OXYCODONE HCL 5 MG/5ML PO SOLN: 5.0000 mg | Freq: Once | ORAL | Status: AC | PRN

## 2016-04-28 MED ORDER — OXYCODONE-ACETAMINOPHEN 5-325 MG PO TABS
ORAL_TABLET | ORAL | Status: AC
  Filled 2016-04-28: qty 1

## 2016-04-28 MED ORDER — FENTANYL CITRATE (PF) 100 MCG/2ML IJ SOLN
INTRAMUSCULAR | Status: AC
  Filled 2016-04-28: qty 2

## 2016-04-28 MED ORDER — LACTATED RINGERS IV SOLN
INTRAVENOUS | Status: DC
  Administered 2016-04-28 (×3): via INTRAVENOUS

## 2016-04-28 MED ORDER — BUPIVACAINE HCL (PF) 0.25 % IJ SOLN
INTRAMUSCULAR | Status: AC
Start: 2016-04-28 — End: 2016-04-28
  Filled 2016-04-28: qty 30

## 2016-04-28 MED ORDER — OXYCODONE HCL 5 MG PO TABS
5.0000 mg | ORAL_TABLET | Freq: Once | ORAL | Status: AC | PRN
  Administered 2016-04-28: 5 mg via ORAL

## 2016-04-28 MED ORDER — DEXAMETHASONE SODIUM PHOSPHATE 10 MG/ML IJ SOLN
INTRAMUSCULAR | Status: AC
  Filled 2016-04-28: qty 1

## 2016-04-28 MED ORDER — HYDROMORPHONE HCL 1 MG/ML IJ SOLN: 0.2500 mg | INTRAMUSCULAR | Status: DC | PRN

## 2016-04-28 MED ORDER — BUPIVACAINE HCL (PF) 0.25 % IJ SOLN
INTRAMUSCULAR | Status: AC
  Filled 2016-04-28: qty 30

## 2016-04-28 MED ORDER — DOCUSATE SODIUM 100 MG PO CAPS: 100.0000 mg | ORAL_CAPSULE | Freq: Two times a day (BID) | ORAL | 0 refills | Status: DC

## 2016-04-28 MED ORDER — FENTANYL CITRATE (PF) 100 MCG/2ML IJ SOLN
INTRAMUSCULAR | Status: AC
Start: 2016-04-28 — End: 2016-04-28
  Filled 2016-04-28: qty 2

## 2016-04-28 MED ORDER — FENTANYL CITRATE (PF) 100 MCG/2ML IJ SOLN
INTRAMUSCULAR | Status: DC | PRN
  Administered 2016-04-28 (×4): 25 ug via INTRAVENOUS

## 2016-04-28 MED ORDER — OXYCODONE-ACETAMINOPHEN 5-325 MG PO TABS
1.0000 | ORAL_TABLET | ORAL | Status: DC | PRN
  Administered 2016-04-28: 1 via ORAL

## 2016-04-28 MED ORDER — 0.9 % SODIUM CHLORIDE (POUR BTL) OPTIME
TOPICAL | Status: DC | PRN
  Administered 2016-04-28: 1000 mL

## 2016-04-28 MED ORDER — OXYCODONE HCL 5 MG PO TABS
ORAL_TABLET | ORAL | Status: AC
  Filled 2016-04-28: qty 1

## 2016-04-28 MED ORDER — BUPIVACAINE-EPINEPHRINE 0.25% -1:200000 IJ SOLN: INTRAMUSCULAR | Status: DC | PRN

## 2016-04-28 MED ORDER — DEXAMETHASONE SODIUM PHOSPHATE 10 MG/ML IJ SOLN
INTRAMUSCULAR | Status: DC | PRN
Start: 2016-04-28 — End: 2016-04-28
  Administered 2016-04-28: 10 mg via INTRAVENOUS

## 2016-04-28 MED ORDER — PROPOFOL 10 MG/ML IV BOLUS
INTRAVENOUS | Status: AC
  Filled 2016-04-28: qty 20

## 2016-04-28 MED ORDER — CEFAZOLIN SODIUM-DEXTROSE 2-4 GM/100ML-% IV SOLN
2.0000 g | INTRAVENOUS | Status: AC
  Administered 2016-04-28: 2 g via INTRAVENOUS
  Filled 2016-04-28: qty 100

## 2016-04-28 MED ORDER — PROPOFOL 10 MG/ML IV BOLUS
INTRAVENOUS | Status: DC | PRN
  Administered 2016-04-28: 170 mg via INTRAVENOUS
  Administered 2016-04-28: 200 mg via INTRAVENOUS

## 2016-04-28 MED ORDER — MIDAZOLAM HCL 2 MG/2ML IJ SOLN
INTRAMUSCULAR | Status: AC
  Filled 2016-04-28: qty 2

## 2016-04-28 MED ORDER — CHLORHEXIDINE GLUCONATE 4 % EX LIQD: 60.0000 mL | Freq: Once | CUTANEOUS | Status: DC

## 2016-04-28 MED ORDER — MIDAZOLAM HCL 2 MG/2ML IJ SOLN
INTRAMUSCULAR | Status: DC | PRN
  Administered 2016-04-28: 2 mg via INTRAVENOUS

## 2016-04-28 MED ORDER — ONDANSETRON HCL 4 MG/2ML IJ SOLN
INTRAMUSCULAR | Status: AC
  Filled 2016-04-28: qty 2

## 2016-04-28 MED ORDER — OXYCODONE-ACETAMINOPHEN 5-325 MG PO TABS: 1.0000 | ORAL_TABLET | Freq: Three times a day (TID) | ORAL | 0 refills | Status: AC

## 2016-04-28 MED ORDER — ONDANSETRON HCL 4 MG/2ML IJ SOLN
INTRAMUSCULAR | Status: DC | PRN
  Administered 2016-04-28: 4 mg via INTRAVENOUS

## 2016-04-28 MED ORDER — ONDANSETRON HCL 4 MG/2ML IJ SOLN: 4.0000 mg | Freq: Once | INTRAMUSCULAR | Status: DC | PRN

## 2016-04-28 MED ORDER — LIDOCAINE HCL (CARDIAC) 20 MG/ML IV SOLN
INTRAVENOUS | Status: DC | PRN
  Administered 2016-04-28: 50 mg via INTRAVENOUS

## 2016-04-28 MED ORDER — PHENYLEPHRINE 40 MCG/ML (10ML) SYRINGE FOR IV PUSH (FOR BLOOD PRESSURE SUPPORT)
PREFILLED_SYRINGE | INTRAVENOUS | Status: AC
Start: 2016-04-28 — End: 2016-04-28
  Filled 2016-04-28: qty 10

## 2016-04-28 SURGICAL SUPPLY — 32 items
BANDAGE ACE 4X5 VEL STRL LF (GAUZE/BANDAGES/DRESSINGS) ×3 IMPLANT
BANDAGE ELASTIC 4 VELCRO ST LF (GAUZE/BANDAGES/DRESSINGS) ×6 IMPLANT
BNDG GAUZE ELAST 4 BULKY (GAUZE/BANDAGES/DRESSINGS) ×6 IMPLANT
CUFF TOURNIQUET SINGLE 18IN (TOURNIQUET CUFF) ×3 IMPLANT
DRAPE SURG 17X11 SM STRL (DRAPES) ×6 IMPLANT
DRSG ADAPTIC 3X8 NADH LF (GAUZE/BANDAGES/DRESSINGS) ×3 IMPLANT
DRSG EMULSION OIL 3X3 NADH (GAUZE/BANDAGES/DRESSINGS) ×3 IMPLANT
DRSG PAD ABDOMINAL 8X10 ST (GAUZE/BANDAGES/DRESSINGS) ×3 IMPLANT
ELECT REM PT RETURN 9FT ADLT (ELECTROSURGICAL) ×3
ELECTRODE REM PT RTRN 9FT ADLT (ELECTROSURGICAL) ×1 IMPLANT
GAUZE SPONGE 4X4 12PLY STRL (GAUZE/BANDAGES/DRESSINGS) ×3 IMPLANT
GLOVE SURG ORTHO 8.0 STRL STRW (GLOVE) ×3 IMPLANT
GOWN STRL REUS W/ TWL XL LVL3 (GOWN DISPOSABLE) ×1 IMPLANT
GOWN STRL REUS W/TWL XL LVL3 (GOWN DISPOSABLE) ×2
IV NS IRRIG 3000ML ARTHROMATIC (IV SOLUTION) IMPLANT
KIT BASIN OR (CUSTOM PROCEDURE TRAY) ×3 IMPLANT
MANIFOLD NEPTUNE II (INSTRUMENTS) IMPLANT
PACK ORTHO EXTREMITY (CUSTOM PROCEDURE TRAY) ×3 IMPLANT
PAD CAST 4YDX4 CTTN HI CHSV (CAST SUPPLIES) ×1 IMPLANT
PADDING CAST COTTON 4X4 STRL (CAST SUPPLIES) ×2
SET CYSTO W/LG BORE CLAMP LF (SET/KITS/TRAYS/PACK) IMPLANT
SOAP 2 % CHG 4 OZ (WOUND CARE) ×3 IMPLANT
SUT PROLENE 3 0 PS 2 (SUTURE) IMPLANT
SUT PROLENE 4-0 FS-2 (SUTURE) ×9 IMPLANT
SUT VIC AB 1 CT1 27 (SUTURE)
SUT VIC AB 1 CT1 27XBRD ANTBC (SUTURE) IMPLANT
SUT VIC AB 2-0 CT1 27 (SUTURE) ×2
SUT VIC AB 2-0 CT1 27XBRD (SUTURE) ×1 IMPLANT
SUT VIC AB 3-0 FS2 27 (SUTURE) ×3 IMPLANT
SYR 20CC LL (SYRINGE) ×3 IMPLANT
SYR CONTROL 10ML LL (SYRINGE) ×3 IMPLANT
TOWEL OR 17X26 10 PK STRL BLUE (TOWEL DISPOSABLE) ×3 IMPLANT

## 2016-04-28 NOTE — Anesthesia Procedure Notes (Signed)
Procedure Name: LMA Insertion Date/Time: 04/28/2016 5:17 PM Performed by: Little IshikawaMERCER, Arisbel Maione L Pre-anesthesia Checklist: Patient identified, Emergency Drugs available, Suction available and Patient being monitored Patient Re-evaluated:Patient Re-evaluated prior to inductionOxygen Delivery Method: Circle System Utilized Preoxygenation: Pre-oxygenation with 100% oxygen Intubation Type: IV induction Ventilation: Mask ventilation without difficulty LMA: LMA inserted LMA Size: 5.0 Number of attempts: 1 Airway Equipment and Method: Bite block Placement Confirmation: positive ETCO2 and breath sounds checked- equal and bilateral Tube secured with: Tape Dental Injury: Teeth and Oropharynx as per pre-operative assessment

## 2016-04-28 NOTE — Discharge Instructions (Signed)
KEEP BANDAGE CLEAN AND DRY °CALL OFFICE FOR F/U APPT [phone] in 14 days °KEEP HAND ELEVATED ABOVE HEART °OK TO APPLY ICE TO OPERATIVE AREA °CONTACT OFFICE IF ANY WORSENING PAIN OR CONCERNS. °

## 2016-04-28 NOTE — Transfer of Care (Signed)
Immediate Anesthesia Transfer of Care Note  Patient: Anthony Bowman  Procedure(s) Performed: Procedure(s): right hand and forearm WOUND EXPLORATION and muscle repair (Right)  Patient Location: PACU  Anesthesia Type:General  Level of Consciousness: awake and alert   Airway & Oxygen Therapy: Patient Spontanous Breathing and Patient connected to nasal cannula oxygen  Post-op Assessment: Report given to RN, Post -op Vital signs reviewed and stable and Patient moving all extremities X 4  Post vital signs: Reviewed and stable  Last Vitals:  Vitals:   04/28/16 1333 04/28/16 1810  BP:    Pulse: 66   Resp:    Temp:  36.5 C    Last Pain:  Vitals:   04/28/16 1333  TempSrc:   PainSc: 4       Patients Stated Pain Goal: 2 (04/28/16 1333)  Complications: No apparent anesthesia complications

## 2016-04-28 NOTE — H&P (Signed)
Anthony Bowman is an 30 y.o. male.   Chief Complaint: RIGHT HAND LACERATION HPI: RIGHT HAND WENT THROUGH GLASS WINDOW PT REFERRED TO OFFICE FROM ED PT SEEN/EVALUATED IN OFFICE AND HERE FOR SURGERY  Past Medical History:  Diagnosis Date  . Asthma   . Environmental and seasonal allergies   . Laceration involving tendon    right hand and forearm with tendon injury  . Pneumonia     Past Surgical History:  Procedure Laterality Date  . WISDOM TOOTH EXTRACTION      Family History  Problem Relation Age of Onset  . Pancreatitis Father    Social History:  reports that he quit smoking about 5 weeks ago. He has quit using smokeless tobacco. His smokeless tobacco use included Chew. He reports that he drinks alcohol. He reports that he does not use drugs.  Allergies:  Allergies  Allergen Reactions  . No Known Allergies     No prescriptions prior to admission.    No results found for this or any previous visit (from the past 48 hour(s)). No results found.  ROS NO RECENT ILLNESSES OR HOSPITALIZATIONS  There were no vitals taken for this visit. Physical Exam  General Appearance:  Alert, cooperative, no distress, appears stated age  Head:  Normocephalic, without obvious abnormality, atraumatic  Eyes:  Pupils equal, conjunctiva/corneas clear,         Throat: Lips, mucosa, and tongue normal; teeth and gums normal  Neck: No visible masses     Lungs:   respirations unlabored  Chest Wall:  No tenderness or deformity  Heart:  Regular rate and rhythm,  Abdomen:   Soft, non-tender,         Extremities: HEALING LACERATIONS OVER VOLAR ASPECT OF FOREARM AND DORSUM OF HAND. LACKS FULL SMALL FINGER EXTENSION ABLE TO CROSS FINGERS FINGERS WARM WELL PERFUSED  Pulses: 2+ and symmetric  Skin: Skin color, texture, turgor normal, no rashes or lesions     Neurologic: Normal    Assessment/Plan RIGHT HAND AND FOREARM LACERATION WITH TENDON INVOLVEMENT  RIGHT  HAND WOUND EXPLORATION AND  REPAIR AS INDICATED  R/B/A DISCUSSED WITH PT IN OFFICE.  PT VOICED UNDERSTANDING OF PLAN CONSENT SIGNED DAY OF SURGERY PT SEEN AND EXAMINED PRIOR TO OPERATIVE PROCEDURE/DAY OF SURGERY SITE MARKED. QUESTIONS ANSWERED WILL GO HOME FOLLOWING SURGERY  R/B/A DISCUSSED WITH PT IN OFFICE.  PT VOICED UNDERSTANDING OF PLAN CONSENT SIGNED DAY OF SURGERY PT SEEN AND EXAMINED PRIOR TO OPERATIVE PROCEDURE/DAY OF SURGERY SITE MARKED. QUESTIONS ANSWERED WILL GO HOME FOLLOWING SURGERY  Anthony Bowman 04/28/2016, 7:29 AM

## 2016-04-28 NOTE — Anesthesia Preprocedure Evaluation (Signed)
Anesthesia Evaluation  Patient identified by MRN, date of birth, ID band Patient awake    Reviewed: Allergy & Precautions, NPO status , Patient's Chart, lab work & pertinent test results  Airway Mallampati: II  TM Distance: >3 FB Neck ROM: Full    Dental  (+) Teeth Intact, Dental Advisory Given   Pulmonary former smoker,    breath sounds clear to auscultation       Cardiovascular  Rhythm:Regular Rate:Normal     Neuro/Psych    GI/Hepatic   Endo/Other    Renal/GU      Musculoskeletal   Abdominal   Peds  Hematology   Anesthesia Other Findings   Reproductive/Obstetrics                             Anesthesia Physical Anesthesia Plan  ASA: II  Anesthesia Plan: General   Post-op Pain Management:    Induction: Intravenous  Airway Management Planned: LMA  Additional Equipment:   Intra-op Plan:   Post-operative Plan:   Informed Consent: I have reviewed the patients History and Physical, chart, labs and discussed the procedure including the risks, benefits and alternatives for the proposed anesthesia with the patient or authorized representative who has indicated his/her understanding and acceptance.   Dental advisory given  Plan Discussed with: CRNA and Anesthesiologist  Anesthesia Plan Comments:         Anesthesia Quick Evaluation  

## 2016-04-28 NOTE — Anesthesia Postprocedure Evaluation (Signed)
Anesthesia Post Note  Patient: Anthony Bowman  Procedure(s) Performed: Procedure(s) (LRB): right hand and forearm WOUND EXPLORATION and muscle repair (Right)  Patient location during evaluation: PACU Anesthesia Type: General Level of consciousness: awake, awake and alert and oriented Pain management: pain level controlled Vital Signs Assessment: post-procedure vital signs reviewed and stable Respiratory status: spontaneous breathing and respiratory function stable Cardiovascular status: blood pressure returned to baseline Anesthetic complications: no    Last Vitals:  Vitals:   04/28/16 1845 04/28/16 1900  BP: (!) 142/101 135/85  Pulse: 70   Resp: 13   Temp: 36.7 C     Last Pain:  Vitals:   04/28/16 1900  TempSrc:   PainSc: 3                  [name] COKER

## 2016-04-29 ENCOUNTER — Encounter (HOSPITAL_COMMUNITY): Payer: Self-pay | Admitting: Orthopedic Surgery

## 2016-04-29 NOTE — Op Note (Signed)
[name]:  Anthony Bowman, Anthony Bowman               ACCOUNT NO.:  [id number]  MEDICAL RECORD NO.:  [id number]  LOCATION:  [facility]                         FACILITY:  [facility]  PHYSICIAN:  Anthony CovertFred W. Reita Shindler Bowman, M.D.DATE OF BIRTH:  [birth year]  DATE OF PROCEDURE:  04/28/2016 DATE OF DISCHARGE:  04/28/2016                              OPERATIVE REPORT   PREOPERATIVE DIAGNOSES: 1. Right forearm laceration with deep foreign body. 2. Right hand laceration with retained foreign body and possible     tendon involvement.  POSTOPERATIVE DIAGNOSES: 1. Right forearm laceration with deep foreign body. 2. Right hand laceration with retained foreign body and possible     tendon involvement.  ATTENDING PHYSICIAN:  Anthony CovertFred W. Anthony Bowman, M.D., who scrubbed and present for the entire procedure.  ASSISTANT SURGEON:  None.  ANESTHESIA:  General via LMA.  SURGICAL PROCEDURE: 1. Right forearm wound exploration and removal of deep foreign body,     retained glass. 2. Traumatic repair laceration, right forearm 3.5 cm. 3. Removal of deep foreign body, right hand, retained glass deep     within the abductor musculature of the hand. 4. Repair of intrinsic muscle of the hand abductor digiti minimi to     the small finger. 5. Traumatic laceration repair of 5 cm dorsal of the hand.  SURGICAL INDICATIONS:  [name] is a right-hand-dominant gentleman, who put his hand through a glass, was referred from the ER to my office. The patient was seen and evaluated and recommended to undergo the above procedure.  Risks, benefits, and alternatives were discussed in detail with the patient.  Signed informed consent was obtained.  Risks include, but not limited to bleeding, infection, damage to nearby nerves, arteries, or tendons; loss of motion to the wrist and digits, and need for further surgical intervention.  DESCRIPTION OF PROCEDURE:  The patient was properly identified in the preoperative holding area and mark with a permanent marker  made on the right forearm and hand to indicate the correct operative site.  The patient was then brought back to the operating room, placed supine on the anesthesia room table where general anesthesia was administered. The patient tolerated this well.  A well-padded tourniquet was placed on the left brachium and sealed with 1000 drape.  Right upper extremity was then prepped and draped in normal sterile fashion.  Time-out was called, correct site was identified, and procedure then begun.  Attention was then turned to the right forearm where he had 3.5 cm laceration that extended both proximally and distally in the ulnar aspect of the forearm in the proximal third.  Dissection was carried down through the skin and subcutaneous tissues.  This did penetrate __________ penetrate the antebrachial fascia.  Once this was carried out __________ small foreign body, retained pieces of glass.  Thorough wound irrigation __________ deep objects were then removed.  Following this, the wound was then irrigated.  Traumatic laceration was then repaired with simple Prolene sutures.  Attention was then turned to the dorsal aspect of the hand, the 5 cm laceration was then extended proximally over the dorsal aspect of the hand.  Deep within the abductor musculature, several shards of glass were then removed.  The  wound was then thoroughly irrigated.  The extensor digiti, combination of the small finger, and the EDQ were both in continuity.  The wound was irrigated.  After removal of the deep foreign body, the abductor digiti minimi, which was lacerated at its insertion was then repaired with Vicryl suture.  Intrinsic muscle repair was then Bowman.  The wound was irrigated __________ traumatic laceration, 5 cm was then closed with Prolene sutures.  Adaptic dressing, sterile compressive bandage were then applied.  The patient was then placed in a well-padded dressing and taken to the recovery room in good  condition.  POSTPROCEDURAL PLAN:  The patient will be discharged home, seen back in the office in approximately 2 weeks for wound check, suture removal, and gradual use and activity.     Anthony Bowman, M.D.     FWO/MEDQ  D:  04/28/2016  T:  04/29/2016  Job:  [id number]

## 2016-05-06 ENCOUNTER — Encounter (HOSPITAL_COMMUNITY): Payer: Self-pay | Admitting: Orthopedic Surgery

## 2018-05-05 ENCOUNTER — Other Ambulatory Visit: Payer: Self-pay

## 2018-05-05 ENCOUNTER — Observation Stay: Payer: Self-pay | Admitting: Certified Registered"

## 2018-05-05 ENCOUNTER — Emergency Department: Payer: Self-pay

## 2018-05-05 ENCOUNTER — Encounter
Admission: EM | Disposition: A | Discharge status: Home / Self Care | Payer: Self-pay | Source: Home / Self Care | Attending: Emergency Medicine

## 2018-05-05 ENCOUNTER — Observation Stay
Admission: EM | Admit: 2018-05-05 | Discharge: 2018-05-07 | Disposition: A | Discharge status: Home / Self Care | Payer: Self-pay | Attending: Surgery | Admitting: Surgery

## 2018-05-05 DIAGNOSIS — K3580 Unspecified acute appendicitis: Secondary | ICD-10-CM | POA: Diagnosis present

## 2018-05-05 DIAGNOSIS — K659 Peritonitis, unspecified: Secondary | ICD-10-CM

## 2018-05-05 DIAGNOSIS — K353 Acute appendicitis with localized peritonitis, without perforation or gangrene: Principal | ICD-10-CM | POA: Insufficient documentation

## 2018-05-05 DIAGNOSIS — Z79899 Other long term (current) drug therapy: Secondary | ICD-10-CM | POA: Insufficient documentation

## 2018-05-05 DIAGNOSIS — J45909 Unspecified asthma, uncomplicated: Secondary | ICD-10-CM | POA: Insufficient documentation

## 2018-05-05 HISTORY — PX: LAPAROSCOPIC APPENDECTOMY: SHX408

## 2018-05-05 LAB — URINALYSIS, COMPLETE (UACMP) WITH MICROSCOPIC
BACTERIA UA: NONE SEEN
Bilirubin Urine: NEGATIVE
GLUCOSE, UA: NEGATIVE mg/dL
Hgb urine dipstick: NEGATIVE
KETONES UR: 20 mg/dL — AB
Leukocytes, UA: NEGATIVE
Nitrite: NEGATIVE
PROTEIN: NEGATIVE mg/dL
Specific Gravity, Urine: >1.046 — ABNORMAL HIGH (ref 1.005–1.030)
WBC UA: NONE SEEN WBC/hpf (ref 0–5)
pH: 5 (ref 5.0–8.0)

## 2018-05-05 LAB — CBC
HCT: 47.2 % (ref 39.0–52.0)
Hemoglobin: 16.2 g/dL (ref 13.0–17.0)
MCH: 30.9 pg (ref 26.0–34.0)
MCHC: 34.3 g/dL (ref 30.0–36.0)
MCV: 90.1 fL (ref 80.0–100.0)
NRBC: 0 % (ref 0.0–0.2)
PLATELETS: 228 10*3/uL (ref 150–400)
RBC: 5.24 MIL/uL (ref 4.22–5.81)
RDW: 12.9 % (ref 11.5–15.5)
WBC: 15.2 10*3/uL — AB (ref 4.0–10.5)

## 2018-05-05 LAB — COMPREHENSIVE METABOLIC PANEL
ALBUMIN: 4.8 g/dL (ref 3.5–5.0)
ALK PHOS: 75 U/L (ref 38–126)
ALT: 27 U/L (ref 0–44)
AST: 25 U/L (ref 15–41)
Anion gap: 12 (ref 5–15)
BUN: 13 mg/dL (ref 6–20)
CHLORIDE: 101 mmol/L (ref 98–111)
CO2: 28 mmol/L (ref 22–32)
CREATININE: 0.98 mg/dL (ref 0.61–1.24)
Calcium: 9.8 mg/dL (ref 8.9–10.3)
GFR calc non Af Amer: >60 mL/min (ref >60)
GLUCOSE: 147 mg/dL — AB (ref 70–99)
Potassium: 3.8 mmol/L (ref 3.5–5.1)
SODIUM: 141 mmol/L (ref 135–145)
Total Bilirubin: 1.1 mg/dL (ref 0.3–1.2)
Total Protein: 7.6 g/dL (ref 6.5–8.1)

## 2018-05-05 LAB — LIPASE, BLOOD: Lipase: 21 U/L (ref 11–51)

## 2018-05-05 LAB — MRSA PCR SCREENING: MRSA by PCR: NEGATIVE

## 2018-05-05 SURGERY — APPENDECTOMY, LAPAROSCOPIC
Anesthesia: General | Site: Abdomen

## 2018-05-05 MED ORDER — DEXAMETHASONE SODIUM PHOSPHATE 10 MG/ML IJ SOLN
INTRAMUSCULAR | Status: DC | PRN
  Administered 2018-05-05: 8 mg via INTRAVENOUS

## 2018-05-05 MED ORDER — CHLORHEXIDINE GLUCONATE CLOTH 2 % EX PADS
6.0000 | MEDICATED_PAD | Freq: Once | CUTANEOUS | Status: AC
  Administered 2018-05-05: 6 via TOPICAL

## 2018-05-05 MED ORDER — ALBUTEROL SULFATE HFA 108 (90 BASE) MCG/ACT IN AERS
INHALATION_SPRAY | RESPIRATORY_TRACT | Status: AC
  Filled 2018-05-05: qty 6.7

## 2018-05-05 MED ORDER — FENTANYL CITRATE (PF) 100 MCG/2ML IJ SOLN
INTRAMUSCULAR | Status: AC
  Filled 2018-05-05: qty 2

## 2018-05-05 MED ORDER — ROCURONIUM BROMIDE 50 MG/5ML IV SOLN
INTRAVENOUS | Status: AC
  Filled 2018-05-05: qty 1

## 2018-05-05 MED ORDER — PIPERACILLIN-TAZOBACTAM 3.375 G IVPB
INTRAVENOUS | Status: AC
  Filled 2018-05-05: qty 50

## 2018-05-05 MED ORDER — MORPHINE SULFATE (PF) 4 MG/ML IV SOLN
6.0000 mg | Freq: Once | INTRAVENOUS | Status: AC
  Administered 2018-05-05: 6 mg via INTRAVENOUS

## 2018-05-05 MED ORDER — PROPOFOL 10 MG/ML IV BOLUS
INTRAVENOUS | Status: DC | PRN
  Administered 2018-05-05: 140 mg via INTRAVENOUS

## 2018-05-05 MED ORDER — SUGAMMADEX SODIUM 200 MG/2ML IV SOLN
INTRAVENOUS | Status: AC
  Filled 2018-05-05: qty 2

## 2018-05-05 MED ORDER — OXYCODONE HCL 5 MG PO TABS
5.0000 mg | ORAL_TABLET | ORAL | Status: DC | PRN
  Administered 2018-05-05 – 2018-05-07 (×8): 10 mg via ORAL
  Filled 2018-05-05 (×8): qty 2

## 2018-05-05 MED ORDER — KETOROLAC TROMETHAMINE 30 MG/ML IJ SOLN
INTRAMUSCULAR | Status: AC
  Filled 2018-05-05: qty 1

## 2018-05-05 MED ORDER — KETOROLAC TROMETHAMINE 30 MG/ML IJ SOLN
INTRAMUSCULAR | Status: DC | PRN
  Administered 2018-05-05: 30 mg via INTRAVENOUS

## 2018-05-05 MED ORDER — HYDROMORPHONE HCL 1 MG/ML IJ SOLN
INTRAMUSCULAR | Status: AC
  Filled 2018-05-05: qty 1

## 2018-05-05 MED ORDER — LIDOCAINE HCL (PF) 2 % IJ SOLN
INTRAMUSCULAR | Status: AC
  Filled 2018-05-05: qty 10

## 2018-05-05 MED ORDER — MIDAZOLAM HCL 2 MG/2ML IJ SOLN
INTRAMUSCULAR | Status: DC | PRN
  Administered 2018-05-05: 2 mg via INTRAVENOUS

## 2018-05-05 MED ORDER — KETOROLAC TROMETHAMINE 30 MG/ML IJ SOLN
30.0000 mg | Freq: Four times a day (QID) | INTRAMUSCULAR | Status: DC
  Administered 2018-05-05 – 2018-05-07 (×8): 30 mg via INTRAVENOUS
  Filled 2018-05-05 (×8): qty 1

## 2018-05-05 MED ORDER — SODIUM CHLORIDE 0.9 % IV BOLUS
1000.0000 mL | Freq: Once | INTRAVENOUS | Status: AC
  Administered 2018-05-05: 1000 mL via INTRAVENOUS

## 2018-05-05 MED ORDER — MIDAZOLAM HCL 2 MG/2ML IJ SOLN
INTRAMUSCULAR | Status: AC
  Filled 2018-05-05: qty 2

## 2018-05-05 MED ORDER — ALBUTEROL SULFATE HFA 108 (90 BASE) MCG/ACT IN AERS
INHALATION_SPRAY | RESPIRATORY_TRACT | Status: DC | PRN
  Administered 2018-05-05: 4 via RESPIRATORY_TRACT

## 2018-05-05 MED ORDER — ACETAMINOPHEN 10 MG/ML IV SOLN
INTRAVENOUS | Status: AC
  Filled 2018-05-05: qty 100

## 2018-05-05 MED ORDER — MEPERIDINE HCL 50 MG/ML IJ SOLN: 6.2500 mg | INTRAMUSCULAR | Status: DC | PRN

## 2018-05-05 MED ORDER — SUCCINYLCHOLINE CHLORIDE 20 MG/ML IJ SOLN
INTRAMUSCULAR | Status: DC | PRN
  Administered 2018-05-05: 100 mg via INTRAVENOUS

## 2018-05-05 MED ORDER — OXYCODONE HCL 5 MG PO TABS: 5.0000 mg | ORAL_TABLET | Freq: Once | ORAL | Status: DC | PRN

## 2018-05-05 MED ORDER — ONDANSETRON 4 MG PO TBDP
4.0000 mg | ORAL_TABLET | Freq: Four times a day (QID) | ORAL | Status: DC | PRN
  Filled 2018-05-05: qty 1

## 2018-05-05 MED ORDER — PIPERACILLIN-TAZOBACTAM 3.375 G IVPB
3.3750 g | Freq: Three times a day (TID) | INTRAVENOUS | Status: DC
  Administered 2018-05-05 – 2018-05-07 (×7): 3.375 g via INTRAVENOUS
  Filled 2018-05-05 (×6): qty 50

## 2018-05-05 MED ORDER — IOPAMIDOL (ISOVUE-300) INJECTION 61%
100.0000 mL | Freq: Once | INTRAVENOUS | Status: AC | PRN
  Administered 2018-05-05: 100 mL via INTRAVENOUS

## 2018-05-05 MED ORDER — ONDANSETRON HCL 4 MG/2ML IJ SOLN: 4.0000 mg | Freq: Four times a day (QID) | INTRAMUSCULAR | Status: DC | PRN

## 2018-05-05 MED ORDER — FENTANYL CITRATE (PF) 100 MCG/2ML IJ SOLN: 25.0000 ug | INTRAMUSCULAR | Status: DC | PRN

## 2018-05-05 MED ORDER — LIDOCAINE HCL (CARDIAC) PF 100 MG/5ML IV SOSY
PREFILLED_SYRINGE | INTRAVENOUS | Status: DC | PRN
  Administered 2018-05-05: 60 mg via INTRAVENOUS

## 2018-05-05 MED ORDER — ALBUTEROL SULFATE (2.5 MG/3ML) 0.083% IN NEBU
2.5000 mg | INHALATION_SOLUTION | Freq: Four times a day (QID) | RESPIRATORY_TRACT | Status: DC | PRN
  Administered 2018-05-05 – 2018-05-06 (×2): 2.5 mg via RESPIRATORY_TRACT
  Filled 2018-05-05 (×2): qty 3

## 2018-05-05 MED ORDER — LACTATED RINGERS IV SOLN
125.0000 mL/h | INTRAVENOUS | Status: DC
  Administered 2018-05-05 – 2018-05-07 (×8): 125 mL/h via INTRAVENOUS

## 2018-05-05 MED ORDER — HALOPERIDOL LACTATE 5 MG/ML IJ SOLN
2.5000 mg | Freq: Once | INTRAMUSCULAR | Status: AC
  Administered 2018-05-05: 2.5 mg via INTRAVENOUS

## 2018-05-05 MED ORDER — DEXAMETHASONE SODIUM PHOSPHATE 10 MG/ML IJ SOLN
INTRAMUSCULAR | Status: AC
  Filled 2018-05-05: qty 1

## 2018-05-05 MED ORDER — MORPHINE SULFATE (PF) 4 MG/ML IV SOLN
INTRAVENOUS | Status: AC
  Filled 2018-05-05: qty 1

## 2018-05-05 MED ORDER — SODIUM CHLORIDE 0.9 % IV SOLN
1.0000 g | Freq: Once | INTRAVENOUS | Status: AC
  Administered 2018-05-05: 1 g via INTRAVENOUS
  Filled 2018-05-05: qty 10

## 2018-05-05 MED ORDER — HYDROMORPHONE HCL 1 MG/ML IJ SOLN
0.5000 mg | INTRAMUSCULAR | Status: DC | PRN
  Administered 2018-05-05: 0.5 mg via INTRAVENOUS
  Filled 2018-05-05: qty 1

## 2018-05-05 MED ORDER — SUGAMMADEX SODIUM 200 MG/2ML IV SOLN
INTRAVENOUS | Status: DC | PRN
  Administered 2018-05-05: 130 mg via INTRAVENOUS

## 2018-05-05 MED ORDER — METRONIDAZOLE IN NACL 5-0.79 MG/ML-% IV SOLN
500.0000 mg | Freq: Once | INTRAVENOUS | Status: AC
  Administered 2018-05-05: 500 mg via INTRAVENOUS
  Filled 2018-05-05: qty 100

## 2018-05-05 MED ORDER — BUPIVACAINE-EPINEPHRINE (PF) 0.5% -1:200000 IJ SOLN
INTRAMUSCULAR | Status: AC
  Filled 2018-05-05: qty 30

## 2018-05-05 MED ORDER — SUCCINYLCHOLINE CHLORIDE 20 MG/ML IJ SOLN
INTRAMUSCULAR | Status: AC
  Filled 2018-05-05: qty 1

## 2018-05-05 MED ORDER — HYDROMORPHONE HCL 1 MG/ML IJ SOLN
INTRAMUSCULAR | Status: DC | PRN
  Administered 2018-05-05: 0.5 mg via INTRAVENOUS

## 2018-05-05 MED ORDER — PROMETHAZINE HCL 25 MG/ML IJ SOLN: 6.2500 mg | INTRAMUSCULAR | Status: DC | PRN

## 2018-05-05 MED ORDER — FAMOTIDINE IN NACL 20-0.9 MG/50ML-% IV SOLN
20.0000 mg | Freq: Two times a day (BID) | INTRAVENOUS | Status: DC
  Administered 2018-05-05 – 2018-05-07 (×5): 20 mg via INTRAVENOUS
  Filled 2018-05-05 (×5): qty 50

## 2018-05-05 MED ORDER — LORAZEPAM 2 MG/ML IJ SOLN: 1.0000 mg | Freq: Four times a day (QID) | INTRAMUSCULAR | Status: DC | PRN

## 2018-05-05 MED ORDER — ACETAMINOPHEN 10 MG/ML IV SOLN
INTRAVENOUS | Status: DC | PRN
  Administered 2018-05-05: 1000 mg via INTRAVENOUS

## 2018-05-05 MED ORDER — ONDANSETRON HCL 4 MG/2ML IJ SOLN
INTRAMUSCULAR | Status: DC | PRN
  Administered 2018-05-05: 4 mg via INTRAVENOUS

## 2018-05-05 MED ORDER — MORPHINE SULFATE (PF) 4 MG/ML IV SOLN
6.0000 mg | Freq: Once | INTRAVENOUS | Status: AC
  Administered 2018-05-05: 6 mg via INTRAVENOUS
  Filled 2018-05-05: qty 2

## 2018-05-05 MED ORDER — FENTANYL CITRATE (PF) 100 MCG/2ML IJ SOLN
INTRAMUSCULAR | Status: DC | PRN
  Administered 2018-05-05 (×2): 50 ug via INTRAVENOUS
  Administered 2018-05-05: 100 ug via INTRAVENOUS

## 2018-05-05 MED ORDER — THIAMINE HCL 100 MG/ML IJ SOLN
100.0000 mg | Freq: Every day | INTRAMUSCULAR | Status: DC
  Administered 2018-05-05 – 2018-05-07 (×3): 100 mg via INTRAVENOUS
  Filled 2018-05-05 (×3): qty 2

## 2018-05-05 MED ORDER — ENOXAPARIN SODIUM 40 MG/0.4ML ~~LOC~~ SOLN
40.0000 mg | SUBCUTANEOUS | Status: DC
  Administered 2018-05-05 – 2018-05-07 (×3): 40 mg via SUBCUTANEOUS
  Filled 2018-05-05 (×3): qty 0.4

## 2018-05-05 MED ORDER — HALOPERIDOL LACTATE 5 MG/ML IJ SOLN
INTRAMUSCULAR | Status: AC
  Filled 2018-05-05: qty 1

## 2018-05-05 MED ORDER — PROPOFOL 10 MG/ML IV BOLUS
INTRAVENOUS | Status: AC
  Filled 2018-05-05: qty 20

## 2018-05-05 MED ORDER — ROCURONIUM BROMIDE 100 MG/10ML IV SOLN
INTRAVENOUS | Status: DC | PRN
  Administered 2018-05-05: 30 mg via INTRAVENOUS
  Administered 2018-05-05: 10 mg via INTRAVENOUS

## 2018-05-05 MED ORDER — BUPIVACAINE-EPINEPHRINE (PF) 0.5% -1:200000 IJ SOLN
INTRAMUSCULAR | Status: DC | PRN
  Administered 2018-05-05: 30 mL via PERINEURAL

## 2018-05-05 MED ORDER — ONDANSETRON HCL 4 MG/2ML IJ SOLN
INTRAMUSCULAR | Status: AC
  Filled 2018-05-05: qty 2

## 2018-05-05 MED ORDER — LORAZEPAM 1 MG PO TABS
1.0000 mg | ORAL_TABLET | Freq: Four times a day (QID) | ORAL | Status: DC | PRN
  Administered 2018-05-06: 1 mg via ORAL
  Filled 2018-05-05: qty 1

## 2018-05-05 MED ORDER — OXYCODONE HCL 5 MG/5ML PO SOLN: 5.0000 mg | Freq: Once | ORAL | Status: DC | PRN

## 2018-05-05 MED ORDER — ACETAMINOPHEN 500 MG PO TABS: 1000.0000 mg | ORAL_TABLET | Freq: Four times a day (QID) | ORAL | Status: DC | PRN

## 2018-05-05 MED ORDER — NICOTINE 7 MG/24HR TD PT24
7.0000 mg | MEDICATED_PATCH | Freq: Every day | TRANSDERMAL | Status: DC
  Administered 2018-05-05 – 2018-05-07 (×3): 7 mg via TRANSDERMAL
  Filled 2018-05-05 (×3): qty 1

## 2018-05-05 SURGICAL SUPPLY — 47 items
BLADE CLIPPER SURG (BLADE) ×3 IMPLANT
BULB RESERV EVAC DRAIN JP 100C (MISCELLANEOUS) ×3 IMPLANT
CANISTER SUCT 1200ML W/VALVE (MISCELLANEOUS) ×3 IMPLANT
CHLORAPREP W/TINT 26ML (MISCELLANEOUS) ×3 IMPLANT
COVER WAND RF STERILE (DRAPES) ×3 IMPLANT
CUTTER FLEX LINEAR 45M (STAPLE) IMPLANT
DERMABOND ADVANCED (GAUZE/BANDAGES/DRESSINGS) ×2
DERMABOND ADVANCED .7 DNX12 (GAUZE/BANDAGES/DRESSINGS) ×1 IMPLANT
DRAIN CHANNEL JP 19F (MISCELLANEOUS) ×3 IMPLANT
DRSG OPSITE POSTOP 3X4 (GAUZE/BANDAGES/DRESSINGS) ×6 IMPLANT
DRSG TEGADERM 4X4.75 (GAUZE/BANDAGES/DRESSINGS) ×3 IMPLANT
ELECT CAUTERY BLADE 6.4 (BLADE) ×3 IMPLANT
ELECT REM PT RETURN 9FT ADLT (ELECTROSURGICAL) ×3
ELECTRODE REM PT RTRN 9FT ADLT (ELECTROSURGICAL) ×1 IMPLANT
GLOVE SURG SYN 7.0 (GLOVE) ×3 IMPLANT
GLOVE SURG SYN 7.5  E (GLOVE) ×2
GLOVE SURG SYN 7.5 E (GLOVE) ×1 IMPLANT
GOWN STRL REUS W/ TWL LRG LVL3 (GOWN DISPOSABLE) ×2 IMPLANT
GOWN STRL REUS W/TWL LRG LVL3 (GOWN DISPOSABLE) ×4
IRRIGATION STRYKERFLOW (MISCELLANEOUS) ×1 IMPLANT
IRRIGATOR STRYKERFLOW (MISCELLANEOUS) ×3
IV NS 1000ML (IV SOLUTION) ×2
IV NS 1000ML BAXH (IV SOLUTION) ×1 IMPLANT
KIT TURNOVER KIT A (KITS) ×3 IMPLANT
LABEL OR SOLS (LABEL) ×3 IMPLANT
LIGASURE LAP MARYLAND 5MM 37CM (ELECTROSURGICAL) IMPLANT
NEEDLE HYPO 22GX1.5 SAFETY (NEEDLE) ×3 IMPLANT
NS IRRIG 500ML POUR BTL (IV SOLUTION) ×3 IMPLANT
PACK LAP CHOLECYSTECTOMY (MISCELLANEOUS) ×3 IMPLANT
PENCIL ELECTRO HAND CTR (MISCELLANEOUS) ×3 IMPLANT
POUCH SPECIMEN RETRIEVAL 10MM (ENDOMECHANICALS) ×3 IMPLANT
RELOAD 45 VASCULAR/THIN (ENDOMECHANICALS) ×3 IMPLANT
RELOAD STAPLE TA45 3.5 REG BLU (ENDOMECHANICALS) IMPLANT
SCISSORS METZENBAUM CVD 33 (INSTRUMENTS) ×3 IMPLANT
SLEEVE ADV FIXATION 5X100MM (TROCAR) ×6 IMPLANT
SPONGE DRAIN TRACH 4X4 STRL 2S (GAUZE/BANDAGES/DRESSINGS) ×3 IMPLANT
STAPLER SKIN PROX 35W (STAPLE) ×3 IMPLANT
SUT ETHILON 3-0 FS-10 30 BLK (SUTURE) ×3
SUT MNCRL 4-0 (SUTURE)
SUT MNCRL 4-0 27XMFL (SUTURE)
SUT VICRYL 0 AB UR-6 (SUTURE) IMPLANT
SUTURE EHLN 3-0 FS-10 30 BLK (SUTURE) ×1 IMPLANT
SUTURE MNCRL 4-0 27XMF (SUTURE) IMPLANT
TRAY FOLEY MTR SLVR 16FR STAT (SET/KITS/TRAYS/PACK) ×3 IMPLANT
TROCAR BALLN GELPORT 12X130M (ENDOMECHANICALS) ×3 IMPLANT
TROCAR Z-THREAD OPTICAL 5X100M (TROCAR) ×3 IMPLANT
TUBING INSUFFLATION (TUBING) ×3 IMPLANT

## 2018-05-05 NOTE — H&P (Signed)
Date of Admission:  05/05/2018  Reason for Admission:  Acute appendicitis  History of Present Illness: Anthony Bowman is a 32 y.o. male with a one day history of mid to lower abdominal pain that started yesterday evening.  The patient reports that he woke up in the middle of the night with really severe pain, associated with nausea and vomiting at home and in the ER.  He reports feeling warm/hot but denies taking his temperature.  Denies chills, chest pain, shortness of breath.  Denies any constipation or diarrhea.  His pain started at the umbilicus and has migrated to the low abdomen and right lower quadrant.   In the ED, workup was done showing labs significant for WBC of 15.2, and CT scan showing a dilated and inflamed appendix with surrounding inflammation of the small bowel, which appears to be likely reactive from the inflammatory process with the appendix.  No evidence of perforation or abscess.  Past Medical History: Past Medical History:  Diagnosis Date  . Asthma   . Environmental and seasonal allergies   . Laceration involving tendon    right hand and forearm with tendon injury  . Pneumonia      Past Surgical History: Past Surgical History:  Procedure Laterality Date  . WISDOM TOOTH EXTRACTION    . WOUND EXPLORATION Right 04/28/2016   Procedure: right hand and forearm WOUND EXPLORATION and muscle repair;  Surgeon: Bradly Bienenstock, MD;  Location: [facility] OR;  Service: Orthopedics;  Laterality: Right;    Home Medications: Prior to Admission medications   Medication Sig Start Date End Date Taking? Authorizing Provider  docusate sodium (COLACE) 100 MG capsule Take 1 capsule (100 mg total) by mouth 2 (two) times daily. Patient not taking: Reported on 05/05/2018 04/28/16   Bradly Bienenstock, MD  HYDROcodone-acetaminophen (NORCO/VICODIN) 5-325 MG tablet Take 1-2 tablets by mouth every 4 (four) hours as needed. Patient not taking: Reported on 05/05/2018 04/19/16   Garey Ham, MD     Allergies: Allergies  Allergen Reactions  . No Known Allergies     Social History:  reports that he quit smoking about 2 years ago. He has quit using smokeless tobacco.  His smokeless tobacco use included chew. He reports that he drinks alcohol. He reports that he does not use drugs.   Family History: Family History  Problem Relation Age of Onset  . Pancreatitis Father     Review of Systems: Review of Systems  Constitutional: Positive for fever. Negative for chills.  HENT: Negative for hearing loss.   Eyes: Negative for blurred vision.  Respiratory: Negative for shortness of breath.   Cardiovascular: Negative for chest pain.  Gastrointestinal: Positive for abdominal pain, nausea and vomiting. Negative for blood in stool, constipation and diarrhea.  Genitourinary: Negative for dysuria.  Musculoskeletal: Negative for myalgias.  Skin: Negative for rash.  Neurological: Negative for dizziness.  Psychiatric/Behavioral: Negative for depression.    Physical Exam BP 112/64 (BP Location: Left Arm)   Pulse 66   Temp 97.9 F (36.6 C) (Oral)   Resp 18   Ht 6' (1.829 m)   Wt 65.8 kg   SpO2 98%   BMI 19.67 kg/m  CONSTITUTIONAL: No acute distress HEENT:  Normocephalic, atraumatic, extraocular motion intact. NECK: Trachea is midline, and there is no jugular venous distension.  RESPIRATORY:  Lungs are clear, and breath sounds are equal bilaterally. Normal respiratory effort without pathologic use of accessory muscles. CARDIOVASCULAR: Heart is regular without murmurs, gallops, or rubs. GI: The abdomen  is soft, non-distended, with localized peritonitis of the lower abdomen towards the right lower quadrant.  No generalized peritonitis. There were no palpable masses. There was no hepatosplenomegaly. MUSCULOSKELETAL:  Normal muscle strength and tone in all four extremities.  No peripheral edema or cyanosis. SKIN: Skin turgor is normal. There are no pathologic skin lesions.   NEUROLOGIC:  Motor and sensation is grossly normal.  Cranial nerves are grossly intact. PSYCH:  Alert and oriented to person, place and time. Affect is normal.  Laboratory Analysis: Results for orders placed or performed during the hospital encounter of 05/05/18 (from the past 24 hour(s))  CBC     Status: Abnormal   Collection Time: 05/05/18  2:07 AM  Result Value Ref Range   WBC 15.2 (H) 4.0 - 10.5 K/uL   RBC 5.24 4.22 - 5.81 MIL/uL   Hemoglobin 16.2 13.0 - 17.0 g/dL   HCT 16.1 09.6 - 04.5 %   MCV 90.1 80.0 - 100.0 fL   MCH 30.9 26.0 - 34.0 pg   MCHC 34.3 30.0 - 36.0 g/dL   RDW 40.9 81.1 - 91.4 %   Platelets 228 150 - 400 K/uL   nRBC 0.0 0.0 - 0.2 %  Comprehensive metabolic panel     Status: Abnormal   Collection Time: 05/05/18  2:07 AM  Result Value Ref Range   Sodium 141 135 - 145 mmol/L   Potassium 3.8 3.5 - 5.1 mmol/L   Chloride 101 98 - 111 mmol/L   CO2 28 22 - 32 mmol/L   Glucose, Bld 147 (H) 70 - 99 mg/dL   BUN 13 6 - 20 mg/dL   Creatinine, Ser 7.82 0.61 - 1.24 mg/dL   Calcium 9.8 8.9 - 95.6 mg/dL   Total Protein 7.6 6.5 - 8.1 g/dL   Albumin 4.8 3.5 - 5.0 g/dL   AST 25 15 - 41 U/L   ALT 27 0 - 44 U/L   Alkaline Phosphatase 75 38 - 126 U/L   Total Bilirubin 1.1 0.3 - 1.2 mg/dL   GFR calc non Af Amer >60 >60 mL/min   GFR calc Af Amer >60 >60 mL/min   Anion gap 12 5 - 15  Lipase, blood     Status: None   Collection Time: 05/05/18  2:07 AM  Result Value Ref Range   Lipase 21 11 - 51 U/L  Urinalysis, Complete w Microscopic     Status: Abnormal   Collection Time: 05/05/18  2:07 AM  Result Value Ref Range   Color, Urine YELLOW (A) YELLOW   APPearance HAZY (A) CLEAR   Specific Gravity, Urine >1.046 (H) 1.005 - 1.030   pH 5.0 5.0 - 8.0   Glucose, UA NEGATIVE NEGATIVE mg/dL   Hgb urine dipstick NEGATIVE NEGATIVE   Bilirubin Urine NEGATIVE NEGATIVE   Ketones, ur 20 (A) NEGATIVE mg/dL   Protein, ur NEGATIVE NEGATIVE mg/dL   Nitrite NEGATIVE NEGATIVE   Leukocytes,  UA NEGATIVE NEGATIVE   RBC / HPF 0-5 0 - 5 RBC/hpf   WBC, UA NONE SEEN 0 - 5 WBC/hpf   Bacteria, UA NONE SEEN NONE SEEN   Squamous Epithelial / LPF 0-5 0 - 5   Mucus PRESENT   MRSA PCR Screening     Status: None   Collection Time: 05/05/18  5:30 AM  Result Value Ref Range   MRSA by PCR NEGATIVE NEGATIVE    Imaging: Ct Abdomen Pelvis W Contrast  Result Date: 05/05/2018 CLINICAL DATA:  Vomiting EXAM: CT ABDOMEN AND  PELVIS WITH CONTRAST TECHNIQUE: Multidetector CT imaging of the abdomen and pelvis was performed using the standard protocol following bolus administration of intravenous contrast. CONTRAST:  ISOVUE-300 IOPAMIDOL (ISOVUE-300) INJECTION 61% COMPARISON:  None. FINDINGS: Lower chest: Lung bases demonstrate no acute consolidation or effusion. The heart size is normal Hepatobiliary: Subcentimeter hypodensity in the liver left lobe, too small to further characterize. No calcified gallstone or biliary dilatation Pancreas: Unremarkable. No pancreatic ductal dilatation or surrounding inflammatory changes. Spleen: Normal in size without focal abnormality. Adrenals/Urinary Tract: Adrenal glands are unremarkable. Kidneys are normal, without renal calculi, focal lesion, or hydronephrosis. Bladder is unremarkable. Stomach/Bowel: The stomach is nonenlarged. No dilated small bowel. Fluid-filled distal small bowel loops in the pelvis with mild wall thickening and mucosal enhancement. Appendix is enlarged at 11 mm with relatively normal caliber tip. There is generalized inflammatory process in the pelvis and right lower quadrant. There is edema at the ileocecal junction. No extraluminal gas. Vascular/Lymphatic: Nonaneurysmal aorta.  No significant adenopathy Reproductive: Prostate is unremarkable. Other: Small to moderate free fluid in the pelvis.  No free air Musculoskeletal: No acute or significant osseous findings. IMPRESSION: 1. Generalized inflammatory process in the right lower quadrant with  thickened inflamed appearing pelvic small bowel loops, and thickened appearance of the ileocecal region. Appendix slightly enlarged at 11 mm with normal caliber of the tip but prominent mucosal enhancement. Differential considerations include acute appendicitis with localized ileus and reactive small bowel and ileocecal thickening versus infectious or inflammatory enteritis with abnormal appearance of appendix secondary to generalized inflammatory process in the pelvis and right lower quadrant. Negative for perforation, periappendiceal fluid collection, or appendicoliths. 2. Small to moderate free fluid in the pelvis. Electronically Signed   By: Jasmine Pang M.D.   On: 05/05/2018 03:13    Assessment and Plan: This is a 32 y.o. male with acute appendicitis.  I have independently viewed the patient's CT scan and reviewed his laboratory studies.  Overall, his CT scan does show significant inflammation changes in the right lower quadrant and pelvis with thickened small bowel, but also shows a dilated appendix with significant surrounding inflammation.  No free air or abscess.  His WBC is 15.2.  Patient will be admitted to the general surgery team and will be taken to the OR today for a laparoscopic appendectomy.  He was started on IV antibiotics in the ED and will continue him on Zosyn in house.  He will be NPO with IV fluid hydration and appropriate pain and nausea control.  The role for laparoscopic appendectomy was discussed with the patient, as well as risks of bleeding, infection, and injury to surrounding structures.  Post-op outcomes, restrictions were discussed as well.  He is willing to proceed.   Howie Ill, MD [facility]:  [phone]

## 2018-05-05 NOTE — Care Management (Signed)
Patient has a PCP is [place], Dr. Yetta Barre. He is uninsured and having difficulty obtaining his medications. Application given for medication management clinic.

## 2018-05-05 NOTE — Progress Notes (Signed)
Pt returned from to room 148 from PACU. Pt is A&Ox4, VSS. 3 abdominal dressings CDI, JP drain to suction. Pt educated on pain control and plan of care. Pt has ambulated to BR x2 and tolerated liquids thus far.   Sarben, Latricia Heft

## 2018-05-05 NOTE — ED Triage Notes (Signed)
Pt in with co mid abd pain since today states has vomited 2-3 times no diarrhea.

## 2018-05-05 NOTE — Anesthesia Post-op Follow-up Note (Signed)
Anesthesia QCDR form completed.        

## 2018-05-05 NOTE — ED Provider Notes (Signed)
[facility] Emergency Department Provider Note  ____________________________________________   First MD Initiated Contact with Patient 05/05/18 0220     (approximate)  I have reviewed the triage vital signs and the nursing notes.   HISTORY  Chief Complaint Abdominal Pain   HPI Anthony Bowman is a 32 y.o. male who comes to the emergency department with gradual onset slowly progressive now severe mid abdominal pain that seems to have migrated down to his right lower quadrant over the past 24 hours.  He has been nauseated and has vomited 2 or 3 times.  He denies fevers or chills.  He denies diarrhea.  He denies chest pain or shortness of breath.  He denies previous abdominal surgery.  His pain is severe at this point and worse with any sort of movement whatsoever and only minimally improved with rest.  No testicular pain.  No trauma.    Past Medical History:  Diagnosis Date  . Asthma   . Environmental and seasonal allergies   . Laceration involving tendon    right hand and forearm with tendon injury  . Pneumonia     Patient Active Problem List   Diagnosis Date Noted  . Acute appendicitis 05/05/2018    Past Surgical History:  Procedure Laterality Date  . APPENDECTOMY    . WISDOM TOOTH EXTRACTION    . WOUND EXPLORATION Right 04/28/2016   Procedure: right hand and forearm WOUND EXPLORATION and muscle repair;  Surgeon: Bradly Bienenstock, MD;  Location: [facility] OR;  Service: Orthopedics;  Laterality: Right;    Prior to Admission medications   Not on File    Allergies No known allergies  Family History  Problem Relation Age of Onset  . Pancreatitis Father     Social History Social History   Tobacco Use  . Smoking status: Current Every Day Smoker    Packs/day: 0.25    Types: Cigarettes    Last attempt to quit: 03/19/2016    Years since quitting: 2.1  . Smokeless tobacco: Former Neurosurgeon    Types: Chew  Substance Use Topics  . Alcohol use: Yes   Alcohol/week: 40.0 standard drinks    Types: 40 Cans of beer per week    Comment: 1 6 pack a day  . Drug use: No    Review of Systems Constitutional: No fever/chills Eyes: No visual changes. ENT: No sore throat. Cardiovascular: Denies chest pain. Respiratory: Denies shortness of breath. Gastrointestinal: Positive for abdominal pain.  Positive for nausea, positive for vomiting.  No diarrhea.  No constipation. Genitourinary: Negative for dysuria. Musculoskeletal: Negative for back pain. Skin: Negative for rash. Neurological: Negative for headaches, focal weakness or numbness.   ____________________________________________   PHYSICAL EXAM:  VITAL SIGNS: ED Triage Vitals  Enc Vitals Group     BP 05/05/18 0205 (!) 143/123     Pulse Rate 05/05/18 0205 84     Resp 05/05/18 0205 20     Temp 05/05/18 0205 97.9 F (36.6 C)     Temp Source 05/05/18 0205 Oral     SpO2 05/05/18 0205 100 %     Weight 05/05/18 0200 145 lb (65.8 kg)     Height 05/05/18 0200 6' (1.829 m)     Head Circumference --      Peak Flow --      Pain Score 05/05/18 0200 10     Pain Loc --      Pain Edu? --      Excl. in GC? --  Constitutional: Alert and oriented x4 shaking and quite uncomfortable appearing Eyes: PERRL EOMI. Head: Atraumatic. Nose: No congestion/rhinnorhea. Mouth/Throat: No trismus Neck: No stridor.   Cardiovascular: Tachycardic rate, regular rhythm. Grossly normal heart sounds.  Good peripheral circulation. Respiratory: Increased respiratory effort.  No retractions. Lungs CTAB and moving good air Gastrointestinal: The patient is frankly peritonitic with exquisite tenderness over McBurney's point with rebound and guarding.  Negative Rovsing's Musculoskeletal: No lower extremity edema   Neurologic:  Normal speech and language. No gross focal neurologic deficits are appreciated. Skin:  Skin is warm, dry and intact. No rash noted. Psychiatric: Mood and affect are normal. Speech and  behavior are normal.    ____________________________________________   DIFFERENTIAL includes but not limited to  Appendicitis, ruptured appendicitis, testicular torsion, diverticulitis, mesenteric adenitis ____________________________________________   LABS (all labs ordered are listed, but only abnormal results are displayed)  Labs Reviewed  CBC - Abnormal; Notable for the following components:      Result Value   WBC 15.2 (*)    All other components within normal limits  COMPREHENSIVE METABOLIC PANEL - Abnormal; Notable for the following components:   Glucose, Bld 147 (*)    All other components within normal limits  URINALYSIS, COMPLETE (UACMP) WITH MICROSCOPIC - Abnormal; Notable for the following components:   Color, Urine YELLOW (*)    APPearance HAZY (*)    Specific Gravity, Urine >1.046 (*)    Ketones, ur 20 (*)    All other components within normal limits  MRSA PCR SCREENING  LIPASE, BLOOD  SURGICAL PATHOLOGY    Lab work reviewed by me with elevated white count concerning for infection.  High specific gravity consistent with dehydration.  Ketosis consistent with starvation __________________________________________  EKG   ____________________________________________  RADIOLOGY  CT abdomen pelvis reviewed by me consistent with nonperforated appendicitis although the patient does have some free fluid in his pelvis ____________________________________________   PROCEDURES  Procedure(s) performed: no  Procedures  Critical Care performed: no  ____________________________________________   INITIAL IMPRESSION / ASSESSMENT AND PLAN / ED COURSE  Pertinent labs & imaging results that were available during my care of the patient were reviewed by me and considered in my medical decision making (see chart for details).   As part of my medical decision making, I reviewed the following data within the electronic MEDICAL RECORD NUMBER History obtained from family if  available, nursing notes, old chart and ekg, as well as notes from prior ED visits.  The patient comes to the emergency department tachycardic and very uncomfortable appearing with frank peritonitis.  I have high clinical suspicion for appendicitis.  I gave the patient 6 mg of IV morphine with 2.5 mg of IV Haldol for pain and nausea with minimal improvement in his symptoms.  I think give him another 6 mg of morphine and his pain has calmed down.  Lab work and CT scan are pending.  The patient is n.p.o. and IV fluids are infusing.  The patient's CT scan is consistent with appendicitis.  I have covered him with ceftriaxone and Flagyl and I spoke with on-call general surgeon Dr. Aleen Campi who has graciously agreed to admit the patient to his service.  The patient and his wife have been updated and agree with the plan.      ____________________________________________   FINAL CLINICAL IMPRESSION(S) / ED DIAGNOSES  Final diagnoses:  Peritonitis (HCC)  Acute appendicitis, unspecified acute appendicitis type      NEW MEDICATIONS STARTED DURING THIS VISIT:  There  are no discharge medications for this patient.    Note:  This document was prepared using Dragon voice recognition software and may include unintentional dictation errors.     Merrily Brittle, MD 05/07/18 [phone]

## 2018-05-05 NOTE — Transfer of Care (Signed)
Immediate Anesthesia Transfer of Care Note  Patient: Anthony Bowman  Procedure(s) Performed: APPENDECTOMY LAPAROSCOPIC (N/A Abdomen)  Patient Location: PACU  Anesthesia Type:General  Level of Consciousness: awake and responds to stimulation  Airway & Oxygen Therapy: Patient Spontanous Breathing  Post-op Assessment: Report given to RN and Post -op Vital signs reviewed and stable  Post vital signs: Reviewed and stable  Last Vitals:  Vitals Value Taken Time  BP 110/87 05/05/2018  2:42 PM  Temp    Pulse 96 05/05/2018  2:42 PM  Resp 12 05/05/2018  2:42 PM  SpO2 100 % 05/05/2018  2:42 PM  Vitals shown include unvalidated device data.  Last Pain:  Vitals:   05/05/18 1252  TempSrc: Temporal  PainSc: 6          Complications: No apparent anesthesia complications

## 2018-05-05 NOTE — Anesthesia Procedure Notes (Signed)
Procedure Name: Intubation Performed by: Lance Muss, CRNA Pre-anesthesia Checklist: Patient identified, Patient being monitored, Timeout performed, Emergency Drugs available and Suction available Patient Re-evaluated:Patient Re-evaluated prior to induction Oxygen Delivery Method: Circle system utilized Preoxygenation: Pre-oxygenation with 100% oxygen Induction Type: IV induction Ventilation: Mask ventilation without difficulty Laryngoscope Size: 4 and Mac Grade View: Grade II Tube type: Oral Tube size: 7.5 mm Number of attempts: 1 Airway Equipment and Method: Stylet Placement Confirmation: ETT inserted through vocal cords under direct vision,  positive ETCO2 and breath sounds checked- equal and bilateral Secured at: 23 cm Tube secured with: Tape Dental Injury: Teeth and Oropharynx as per pre-operative assessment

## 2018-05-05 NOTE — Op Note (Signed)
  Procedure Date:  05/05/2018  Pre-operative Diagnosis:  Acute appendicitis  Post-operative Diagnosis:  Acute appendicitis  Procedure:  Laparoscopic appendectomy  Surgeon:  Howie Ill, MD  Anesthesia:  General endotracheal  Estimated Blood Loss:  20 ml  Specimens:  appendix  Complications:  None  Indications for Procedure:  This is a 32 y.o. male who presents with abdominal pain and workup revealing acute appendicitis.  The options of surgery versus observation were reviewed with the patient and/or family. The risks of bleeding, infection, recurrence of symptoms, negative laparoscopy, potential for an open procedure, bowel injury, abscess or infection, were all discussed with the patient and he was willing to proceed.  Description of Procedure: The patient was correctly identified in the preoperative area and brought into the operating room.  The patient was placed supine with VTE prophylaxis in place.  Appropriate time-outs were performed.  Anesthesia was induced and the patient was intubated.  Foley catheter was placed.  Appropriate antibiotics were infused.  The abdomen was prepped and draped in a sterile fashion. An infraumbilical incision was made. A cutdown technique was used to enter the abdominal cavity without injury, and a Hasson trocar was inserted.  Pneumoperitoneum was obtained with appropriate opening pressures.  Two 5-mm ports were placed in the suprapubic and left lateral positions under direct visualization.  The right lower quadrant was inspected and the appendix was identified and found to be acutely inflamed.  There was purulent fluid in the right lower quadrant and pelvis.  This was suctioned.  The appendix was carefully dissected.  The mesoappendix was divided using a vascular load Endo GIA stapler.  The base of the appendix was dissected out and divided with a standard load Endo GIA.  The appendix was placed in an Endocatch bag.  The right lower quadrant was  then inspected again revealing an intact staple line, no bleeding, and no bowel injury.  The area was thoroughly irrigated including the pelvis, right lower quadrant, and right upper quadrant.  A 19 Fr. Blake drain was placed via the left lateral incision, going to the right lower quadrant and pelvis.  The 5 mm ports were removed under direct visualization and the Hasson trocar was removed.  The Endocatch bag was brought out through the umbilical incision.  The fascial opening was closed using 0 vicryl suture.  Local anesthetic was infused in all incisions and the incisions were closed with skin stapler.  The drain was secured using 3-0 Nylon.  The wounds were cleaned and dressed with Honeycomb dressing and 4x4 gauze and Tegaderm for the drain.   Foley catheter was removed and the patient was emerged from anesthesia and extubated and brought to the recovery room for further management.  The patient tolerated the procedure well and all counts were correct at the end of the case.   Howie Ill, MD

## 2018-05-05 NOTE — Anesthesia Preprocedure Evaluation (Signed)
Anesthesia Evaluation  Patient identified by MRN, date of birth, ID band Patient awake    Reviewed: Allergy & Precautions, NPO status , Patient's Chart, lab work & pertinent test results  History of Anesthesia Complications Negative for: history of anesthetic complications  Airway Mallampati: II  TM Distance: >3 FB Neck ROM: Full    Dental  (+) Poor Dentition   Pulmonary asthma , former smoker,    breath sounds clear to auscultation- rhonchi (-) wheezing      Cardiovascular Exercise Tolerance: Good (-) hypertension(-) CAD, (-) Past MI, (-) Cardiac Stents and (-) CABG  Rhythm:Regular Rate:Normal - Systolic murmurs and - Diastolic murmurs    Neuro/Psych negative neurological ROS  negative psych ROS   GI/Hepatic negative GI ROS, Neg liver ROS,   Endo/Other  negative endocrine ROSneg diabetes  Renal/GU negative Renal ROS     Musculoskeletal negative musculoskeletal ROS (+)   Abdominal (+) - obese,   Peds  Hematology negative hematology ROS (+)   Anesthesia Other Findings Past Medical History: No date: Asthma No date: Environmental and seasonal allergies No date: Laceration involving tendon     Comment:  right hand and forearm with tendon injury No date: Pneumonia   Reproductive/Obstetrics                             Anesthesia Physical Anesthesia Plan  ASA: II  Anesthesia Plan: General   Post-op Pain Management:    Induction: Intravenous  PONV Risk Score and Plan: 1 and Ondansetron and Midazolam  Airway Management Planned: Oral ETT  Additional Equipment:   Intra-op Plan:   Post-operative Plan: Extubation in OR  Informed Consent: I have reviewed the patients History and Physical, chart, labs and discussed the procedure including the risks, benefits and alternatives for the proposed anesthesia with the patient or authorized representative who has indicated his/her  understanding and acceptance.   Dental advisory given  Plan Discussed with: CRNA and Anesthesiologist  Anesthesia Plan Comments:         Anesthesia Quick Evaluation

## 2018-05-06 MED ORDER — POLYETHYLENE GLYCOL 3350 17 G PO PACK
17.0000 g | PACK | Freq: Every day | ORAL | Status: DC
  Administered 2018-05-06: 17 g via ORAL
  Filled 2018-05-06 (×2): qty 1

## 2018-05-06 NOTE — Progress Notes (Signed)
SURGICAL PROGRESS NOTE   Hospital Day(s): 0.   Post op day(s): 1 Day Post-Op.   Interval History: Patient seen and examined, no acute events or new complaints overnight. Patient reports that right lower quadrant pain resolved but sore on insicions area, denies nausea or vomiting, fever or chills. Refers tolerated diet.   Vital signs in last 24 hours: [min-max] current  Temp:  [97.7 F (36.5 C)-98.2 F (36.8 C)] 97.9 F (36.6 C) (10/19 1632) Pulse Rate:  [62-71] 68 (10/19 1632) Resp:  [16-18] 18 (10/19 1632) BP: (130-149)/(68-104) 140/88 (10/19 1859) SpO2:  [99 %-100 %] 100 % (10/19 1632)     Height: 6' (182.9 cm) Weight: 65.8 kg BMI (Calculated): 19.66   Physical Exam:  Constitutional: alert, cooperative and no distress  Respiratory: breathing non-labored at rest  Cardiovascular: regular rate and sinus rhythm  Gastrointestinal: soft, mild-tender, and non-distended. Wounds dry and clean with drainage in place, serous.   Labs:  CBC Latest Ref Rng & Units 05/05/2018 04/28/2016  WBC 4.0 - 10.5 K/uL 15.2(H) 7.2  Hemoglobin 13.0 - 17.0 g/dL 16.1 12.8(L)  Hematocrit 39.0 - 52.0 % 47.2 37.3(L)  Platelets 150 - 400 K/uL 228 247   CMP Latest Ref Rng & Units 05/05/2018  Glucose 70 - 99 mg/dL 096(E)  BUN 6 - 20 mg/dL 13  Creatinine 4.54 - 0.98 mg/dL 1.19  Sodium 147 - 829 mmol/L 141  Potassium 3.5 - 5.1 mmol/L 3.8  Chloride 98 - 111 mmol/L 101  CO2 22 - 32 mmol/L 28  Calcium 8.9 - 10.3 mg/dL 9.8  Total Protein 6.5 - 8.1 g/dL 7.6  Total Bilirubin 0.3 - 1.2 mg/dL 1.1  Alkaline Phos 38 - 126 U/L 75  AST 15 - 41 U/L 25  ALT 0 - 44 U/L 27    Imaging studies: No new pertinent imaging studies   Assessment/Plan:  32 y.o. male with acute purulent appendicitis 1 Day Post-Op s/p lap appendectomy. Patient tolerated procedure well. Recovering properly. Due to significant amount of purulence on peri appendiceal area, IV abx recommended. If patient does not has fever, tachycardia or  significant abdominal pain in the next 24 hours, may be discharged tomorrow. Patient ambulating. Still with pain management.   Gae Gallop, MD

## 2018-05-06 NOTE — Plan of Care (Signed)
  Problem: Education: Goal: Knowledge of General Education information will improve Description Including pain rating scale, medication(s)/side effects and non-pharmacologic comfort measures Outcome: Progressing   

## 2018-05-07 MED ORDER — AMOXICILLIN-POT CLAVULANATE 875-125 MG PO TABS: 1.0000 | ORAL_TABLET | Freq: Two times a day (BID) | ORAL | 0 refills | Status: AC

## 2018-05-07 MED ORDER — OXYCODONE HCL 5 MG PO TABS: 5.0000 mg | ORAL_TABLET | ORAL | 0 refills | Status: DC | PRN

## 2018-05-07 NOTE — Progress Notes (Signed)
Pt remaining alert. Pt was a complaining of some anxiety last night, relieved with prn ativan. Offered pain medication during the night pt declined just stating that he is sore. Up to bathroom and voiding without difficulty. Pt states that he is able to pass some flatus. No bowel movement at this time.

## 2018-05-07 NOTE — Discharge Summary (Signed)
  Patient ID: Anthony Bowman MRN: [id number] DOB/AGE: 33-Aug-1987 33 y.o.  Admit date: 05/05/2018 Discharge date: 05/07/2018   Discharge Diagnoses:  Active Problems:   Acute appendicitis   Procedures: Laparoscopic appendectomy  Hospital Course: Patient with purulent appendicitis. Tolerated procedure well. Due to amount of pus patient needed extended IV antibiotic therapy. Today doing well. Tolerating regular diet, ambulating and pain controlled. Patient passing gas and voiding spontaneously.   Physical Exam  Constitutional: He is well-developed, well-nourished, and in no distress.  HENT:  Head: Normocephalic.  Neck: Normal range of motion.  Cardiovascular: Normal rate and regular rhythm.  Pulmonary/Chest: Effort normal.  Abdominal: Soft. He exhibits no distension.  Wounds: dry and clean with serous fluid through drain.    Consults: None  Disposition: Discharge disposition: 01-Home or Self Care       Discharge Instructions    Diet - low sodium heart healthy   Complete by:  As directed      Allergies as of 05/07/2018      Reactions   No Known Allergies       Medication List    TAKE these medications   amoxicillin-clavulanate 875-125 MG tablet Commonly known as:  AUGMENTIN Take 1 tablet by mouth 2 (two) times daily for 7 days.   oxyCODONE 5 MG immediate release tablet Commonly known as:  Oxy IR/ROXICODONE Take 1-2 tablets (5-10 mg total) by mouth every 4 (four) hours as needed for moderate pain or severe pain.      Follow-up Information    Henrene Dodge, MD Follow up on 05/09/2018.   Specialty:  General Surgery Why:  Please schedule appointment for 10/22 for drain and staple removal. Contact information: [address] [place] [address] [phone]

## 2018-05-07 NOTE — Progress Notes (Signed)
Discharge instructions given to pt. Prescriptions faxed to pharmacy. Coupon for antibiotic given to pt. IV removed. No questions or concerns from pt at this time. Pt getting dressed and will discharge home with girlfriend.

## 2018-05-07 NOTE — Discharge Instructions (Signed)
°  Diet: Resume home heart healthy regular diet.   Activity: No heavy lifting >20 pounds (children, pets, laundry, garbage) or strenuous activity until follow-up, but light activity and walking are encouraged. Do not drive or drink alcohol if taking narcotic pain medications.  Wound care: May shower with soapy water and pat dry (do not rub incisions), but no baths or submerging incision underwater until follow-up. (no swimming)   Medications: Resume all home medications. For mild to moderate pain: acetaminophen (Tylenol) or ibuprofen (if no kidney disease). Combining Tylenol with alcohol can substantially increase your risk of causing liver disease. Narcotic pain medications, if prescribed, can be used for severe pain, though may cause nausea, constipation, and drowsiness. Do not combine Tylenol and Norco within a 6 hour period as Norco contains Tylenol. If you do not need the narcotic pain medication, you do not need to fill the prescription.  Call office: Call Dr. Adelene Idler office at any time if any questions, worsening pain, fevers/chills, bleeding, drainage from incision site, or other concerns.

## 2018-05-08 ENCOUNTER — Other Ambulatory Visit: Payer: Self-pay

## 2018-05-08 ENCOUNTER — Encounter: Payer: Self-pay | Admitting: Surgery

## 2018-05-08 ENCOUNTER — Ambulatory Visit (INDEPENDENT_AMBULATORY_CARE_PROVIDER_SITE_OTHER): Payer: Self-pay | Admitting: Surgery

## 2018-05-08 VITALS — BP 151/98 | HR 57 | Temp 97.7°F | Ht 72.0 in | Wt 148.0 lb

## 2018-05-08 DIAGNOSIS — Z09 Encounter for follow-up examination after completed treatment for conditions other than malignant neoplasm: Secondary | ICD-10-CM

## 2018-05-08 NOTE — Patient Instructions (Addendum)
Please give us a call in case you have any questions or concerns.  

## 2018-05-08 NOTE — Progress Notes (Signed)
S/p lap appy by dr. Aleen Campi 10/18 10cc drain daily serous Taking po, no fevers Had issues with drain and almost came out Taking PO Pending path  PE NAD Abd: soft, minimal incisional tenderness, drain removed. No infection or peritonitis  A/p Doing well F/U next week for staple removal No complications

## 2018-05-08 NOTE — Anesthesia Postprocedure Evaluation (Signed)
Anesthesia Post Note  Patient: Anthony Bowman  Procedure(s) Performed: APPENDECTOMY LAPAROSCOPIC (N/A Abdomen)  Patient location during evaluation: PACU Anesthesia Type: General Level of consciousness: awake and alert Pain management: pain level controlled Vital Signs Assessment: post-procedure vital signs reviewed and stable Respiratory status: spontaneous breathing, nonlabored ventilation and respiratory function stable Cardiovascular status: blood pressure returned to baseline and stable Postop Assessment: no apparent nausea or vomiting Anesthetic complications: no     Last Vitals:  Vitals:   05/07/18 0007 05/07/18 0832  BP: (!) 138/95 (!) 155/106  Pulse: 62 (!) 57  Resp: 17 18  Temp: 36.6 C 36.6 C  SpO2: 100% 100%    Last Pain:  Vitals:   05/07/18 1024  TempSrc:   PainSc: 4                  Arlis Yale Garry Heater

## 2018-05-09 ENCOUNTER — Encounter: Payer: Self-pay | Admitting: Surgery

## 2018-05-09 LAB — SURGICAL PATHOLOGY

## 2018-05-11 ENCOUNTER — Telehealth: Payer: Self-pay | Admitting: *Deleted

## 2018-05-11 DIAGNOSIS — R195 Other fecal abnormalities: Secondary | ICD-10-CM

## 2018-05-11 NOTE — Telephone Encounter (Signed)
The patient called back and states that his pain is really bad first thing in the morning. He has a very hard time getting out of the bed to use the bathroom. He states that he is having loose pudding like BMs that are very dark brown, but not black. He has been alternating his prescription pain medication with Tylenol but has used the last of his Oxycodone. He would like to see about getting some more pain medication especially for first thing in the morning. Patient instructed that he may use Aleve and a heating pad for pain control and that we would ask the provider about more pain medications and notify him about his stools.  Spoke with Dr Dahlia Byes and he said to have the patient tested for C-Diff due to the loose dark stools. He may come in to be seen tomorrow if he likes for concerns about pain control.  I spoke with the patient and he will pick up the kit for C-Diff and have this done. He states that he does not feel the need to come in tomorrow and will try the Aleve and heating pad. He is aware he may call back for any more concerns.

## 2018-05-11 NOTE — Telephone Encounter (Signed)
I called and talked with Victorino Dike (significant other) she has cell phone now, he doesn't have a phone. She states he has been having bowls movements, started with a BM dark and loose at 4 am yesterday and went 4 times that day.  Discussed various things, fluids symptoms, aleve use, heating pad use, abdominal symptoms, ? diarrhea. She will get her mother to go check on him dn have him call us using her phone when possible.

## 2018-05-11 NOTE — Telephone Encounter (Signed)
Patient called wanting to know if abdominal discomfort is normal after a week of surgery. He stated that he is taking his pain medicine on a regular basis and also taking tylenol. He stated that the discomfort is worse in the morning time

## 2018-05-17 ENCOUNTER — Other Ambulatory Visit: Payer: Self-pay

## 2018-05-17 ENCOUNTER — Encounter: Payer: Self-pay | Admitting: Surgery

## 2018-05-17 ENCOUNTER — Ambulatory Visit (INDEPENDENT_AMBULATORY_CARE_PROVIDER_SITE_OTHER): Payer: Self-pay | Admitting: Surgery

## 2018-05-17 VITALS — BP 121/72 | HR 59 | Temp 97.9°F | Resp 14 | Ht 72.0 in | Wt 149.2 lb

## 2018-05-17 DIAGNOSIS — Z09 Encounter for follow-up examination after completed treatment for conditions other than malignant neoplasm: Secondary | ICD-10-CM

## 2018-05-17 DIAGNOSIS — K3580 Unspecified acute appendicitis: Secondary | ICD-10-CM

## 2018-05-17 MED ORDER — OXYCODONE HCL 5 MG PO TABS: 5.0000 mg | ORAL_TABLET | Freq: Three times a day (TID) | ORAL | 0 refills | Status: AC | PRN

## 2018-05-17 NOTE — Patient Instructions (Signed)
Patient has been advised to call the office with any questions or concerns. Follow up as needed.

## 2018-05-18 ENCOUNTER — Encounter: Payer: Self-pay | Admitting: Surgery

## 2018-05-18 NOTE — Progress Notes (Signed)
05/18/2018  HPI: Anthony Bowman is a 32 y.o. male s/p laparoscopic appendectomy on 05/05/2018.  He presents for follow-up.  He has been seen last week by Dr. Everlene Farrier because he felt his drain had almost fallen out.  During the visit drain was removed with no complications.  Staples were left in place.  Reports that he still having pain at his incisions and ran out of the narcotic medication has been trying Aleve and ibuprofen without much improvement.  Vital signs: BP 121/72   Pulse (!) 59   Temp 97.9 F (36.6 C) (Temporal)   Resp 14   Ht 6' (1.829 m)   Wt 149 lb 3.2 oz (67.7 kg)   SpO2 96%   BMI 20.24 kg/m    Physical Exam: Constitutional: No acute distress Abdomen: Soft, nondistended, appropriately tender to palpation over the incisions.  Incisions are clean dry and intact.  Staples were removed with no complications but the patient was somewhat tender during staple removal.  There is no evidence of infection or fluctuance to suggest infection as of the incisions.  Assessment/Plan: This is a 32 y.o. male s/p laparoscopic appendectomy.  - Staples were removed and changed by [employer]. - Patient still knows not to do any heavy lifting of more than 10 to 15 pounds for a period of 2 more weeks. - We will give the patient new prescription for oxycodone 5 mg tablets x20.   Howie Ill, MD [facility]

## 2019-07-18 IMAGING — CT CT ABD-PELV W/ CM
2 of 4 series · 15 of 46 positions shown, 17 images · IV contrast (iopamidol)
Comparison: None.

CLINICAL DATA: Vomiting

EXAM:
CT ABDOMEN AND PELVIS WITH CONTRAST
TECHNIQUE: Multidetector CT imaging of the abdomen and pelvis was performed
using the standard protocol following bolus administration of
intravenous contrast.
CONTRAST:  100mL OE8PBP-OII IOPAMIDOL (OE8PBP-OII) INJECTION 61%

[Series 2: axial st · axial · 0.77mm/px · z∈[-979,-554]mm · 12 of 93 slices shown, 14 images]
[im 4/93  soft-tissue]
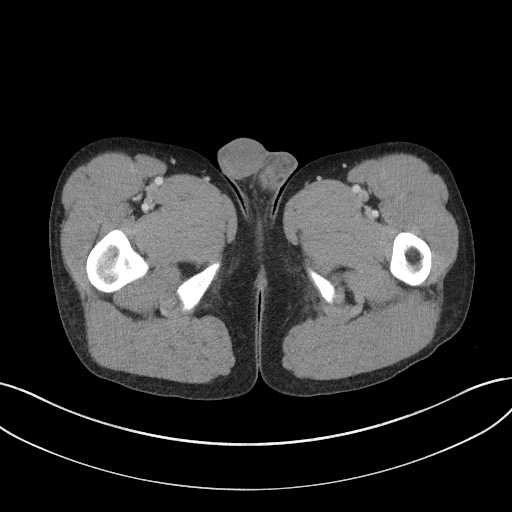
[im 4/93  bone]
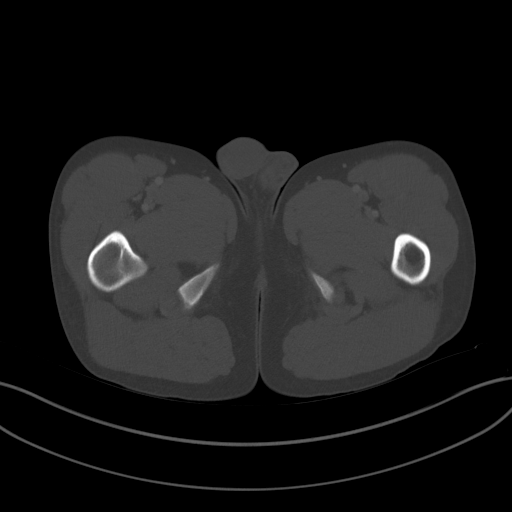
[im 12/93  soft-tissue]
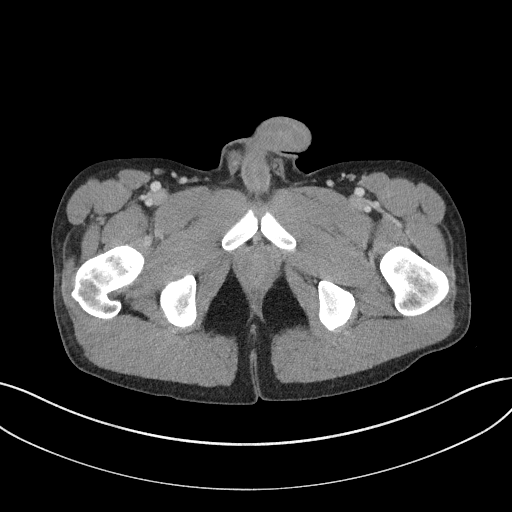
[im 20/93  soft-tissue]
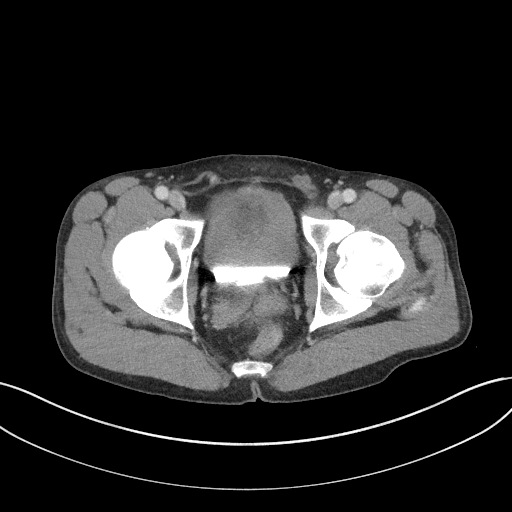
[im 27/93  soft-tissue]
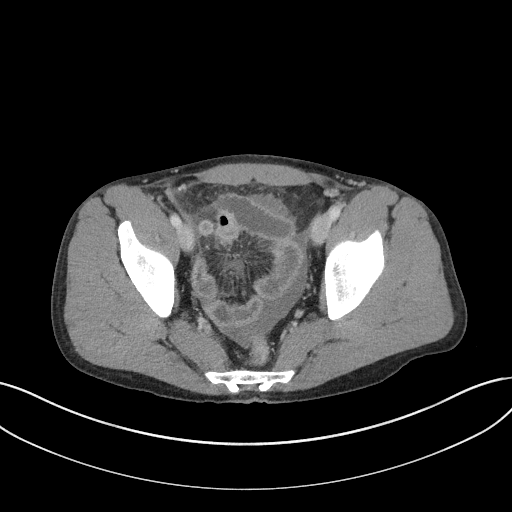
[im 35/93  soft-tissue]
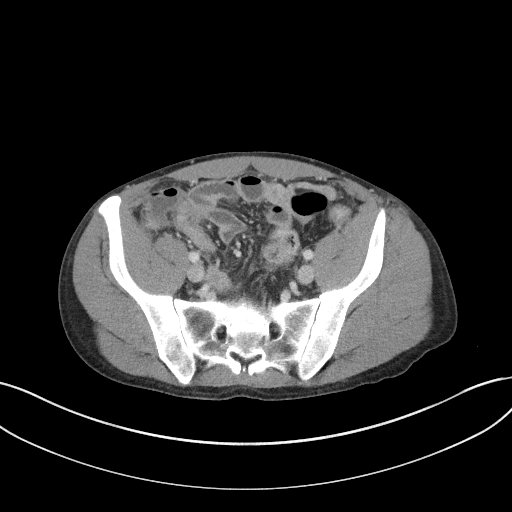
[im 43/93  soft-tissue]
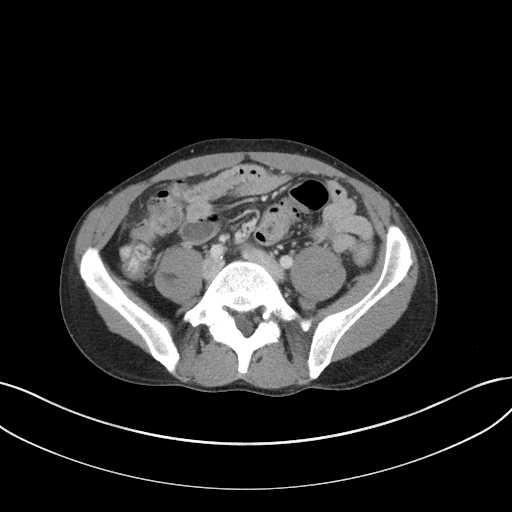
[im 50/93  soft-tissue]
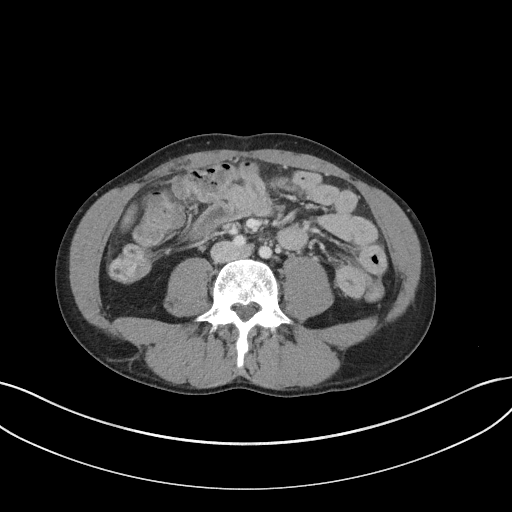
[im 58/93  soft-tissue]
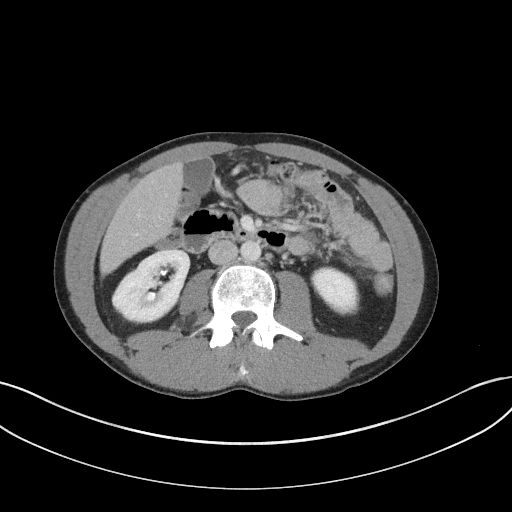
[im 66/93  soft-tissue]
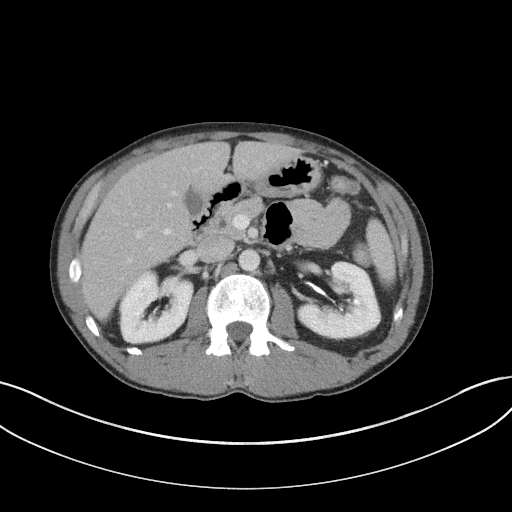
[im 66/93  bone]
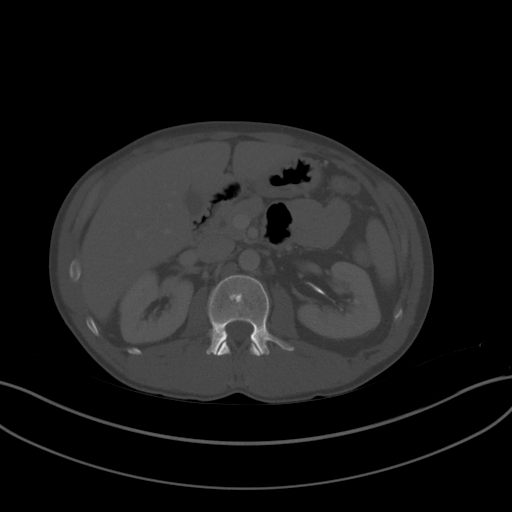
[im 73/93  soft-tissue]
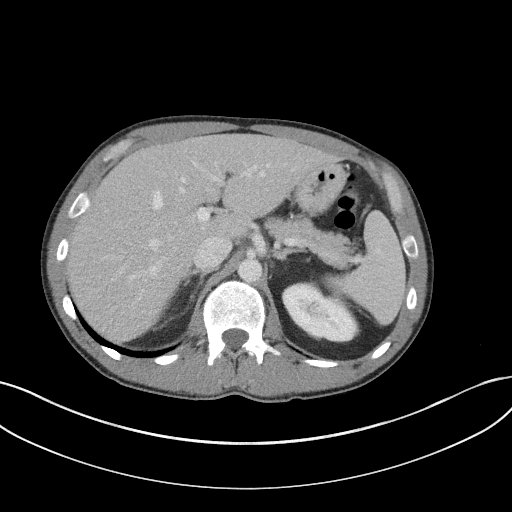
[im 81/93  soft-tissue]
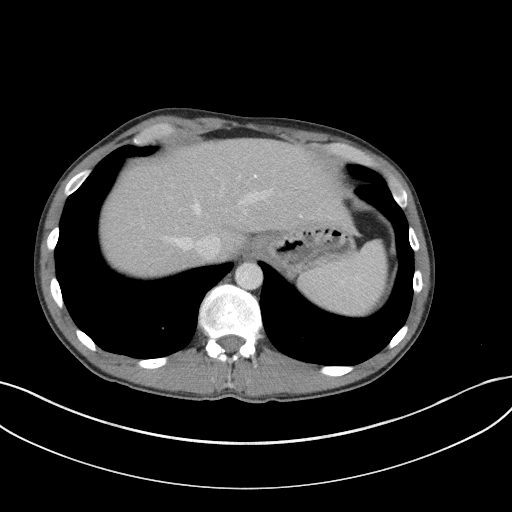
[im 89/93  soft-tissue]
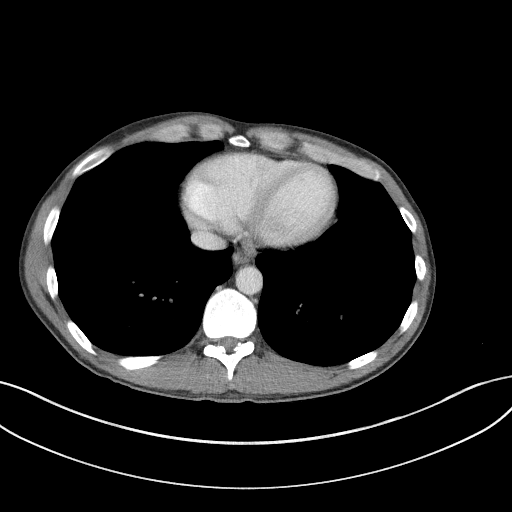

[Series 5: coronal st · coronal · 0.68mm/px · 3 of 101 slices shown]
[im 34/101  soft-tissue]
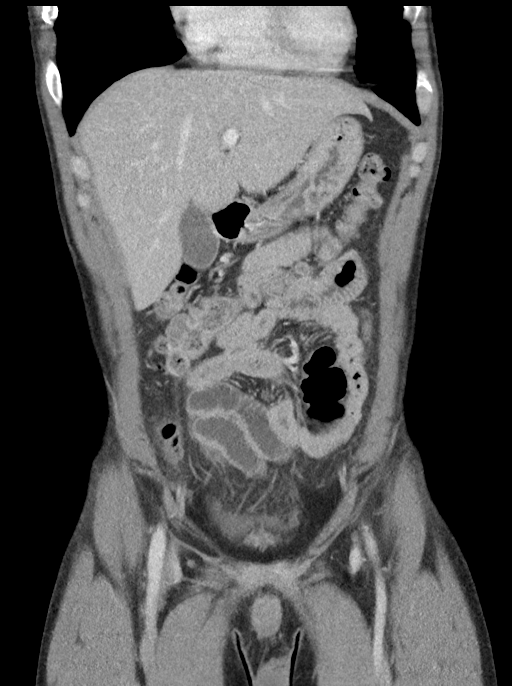
[im 45/101  soft-tissue]
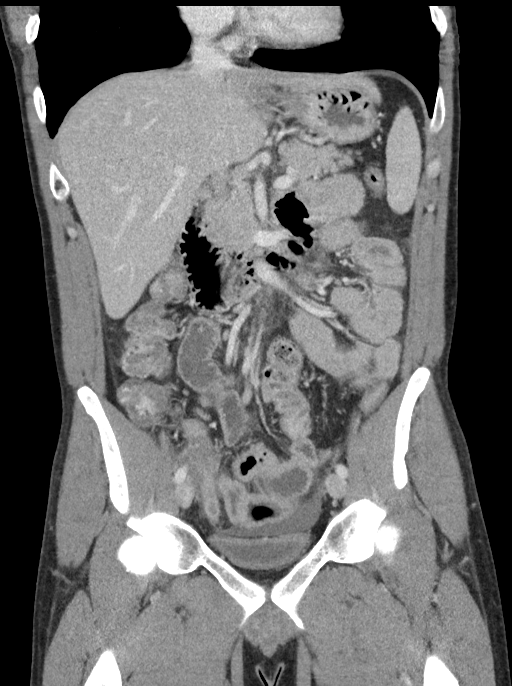
[im 56/101  soft-tissue]
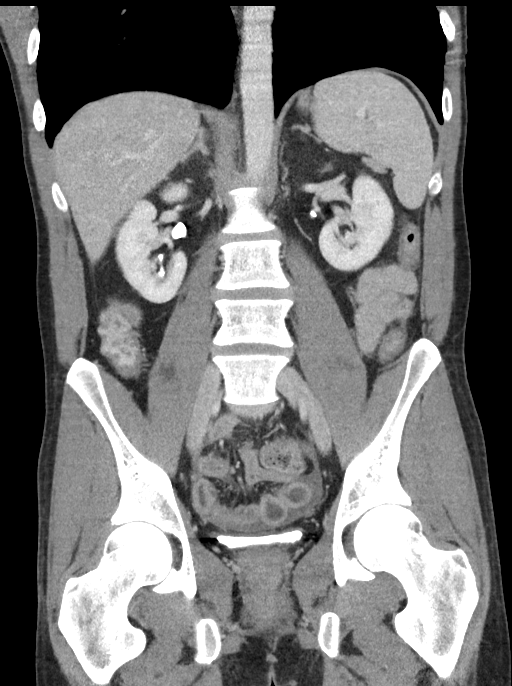

[15 of 46 positions shown; findings below may reference images not displayed]

FINDINGS: Lower chest: Lung bases demonstrate no acute consolidation or
effusion. The heart size is normal

Hepatobiliary: Subcentimeter hypodensity in the liver left lobe, too
small to further characterize. No calcified gallstone or biliary
dilatation

Pancreas: Unremarkable. No pancreatic ductal dilatation or
surrounding inflammatory changes.

Spleen: Normal in size without focal abnormality.

Adrenals/Urinary Tract: Adrenal glands are unremarkable. Kidneys are
normal, without renal calculi, focal lesion, or hydronephrosis.
Bladder is unremarkable.

Stomach/Bowel: The stomach is nonenlarged. No dilated small bowel.
Fluid-filled distal small bowel loops in the pelvis with mild wall
thickening and mucosal enhancement. Appendix is enlarged at 11 mm
with relatively normal caliber tip. There is generalized
inflammatory process in the pelvis and right lower quadrant. There
is edema at the ileocecal junction. No extraluminal gas.

Vascular/Lymphatic: Nonaneurysmal aorta.  No significant adenopathy

Reproductive: Prostate is unremarkable.

Other: Small to moderate free fluid in the pelvis.  No free air

Musculoskeletal: No acute or significant osseous findings.
IMPRESSION: 1. Generalized inflammatory process in the right lower quadrant with
thickened inflamed appearing pelvic small bowel loops, and thickened
appearance of the ileocecal region. Appendix slightly enlarged at 11
mm with normal caliber of the tip but prominent mucosal enhancement.
Differential considerations include acute appendicitis with
localized ileus and reactive small bowel and ileocecal thickening
versus infectious or inflammatory enteritis with abnormal appearance
of appendix secondary to generalized inflammatory process in the
pelvis and right lower quadrant. Negative for perforation,
periappendiceal fluid collection, or appendicoliths.
2. Small to moderate free fluid in the pelvis.

## 2021-01-12 ENCOUNTER — Ambulatory Visit (INDEPENDENT_AMBULATORY_CARE_PROVIDER_SITE_OTHER): Payer: Self-pay | Admitting: Family Medicine

## 2021-01-12 ENCOUNTER — Other Ambulatory Visit: Payer: Self-pay

## 2021-01-12 ENCOUNTER — Encounter: Payer: Self-pay | Admitting: Family Medicine

## 2021-01-12 VITALS — BP 130/70 | HR 76 | Ht 72.0 in | Wt 159.0 lb

## 2021-01-12 DIAGNOSIS — Z0000 Encounter for general adult medical examination without abnormal findings: Secondary | ICD-10-CM

## 2021-01-12 DIAGNOSIS — J4520 Mild intermittent asthma, uncomplicated: Secondary | ICD-10-CM

## 2021-01-12 MED ORDER — ALBUTEROL SULFATE HFA 108 (90 BASE) MCG/ACT IN AERS: 2.0000 | INHALATION_SPRAY | Freq: Four times a day (QID) | RESPIRATORY_TRACT | 11 refills | Status: AC | PRN

## 2021-01-12 MED ORDER — ALBUTEROL SULFATE HFA 108 (90 BASE) MCG/ACT IN AERS: 2.0000 | INHALATION_SPRAY | Freq: Four times a day (QID) | RESPIRATORY_TRACT | 0 refills | Status: DC | PRN

## 2021-01-12 MED ORDER — ALBUTEROL SULFATE HFA 108 (90 BASE) MCG/ACT IN AERS
2.0000 | INHALATION_SPRAY | Freq: Four times a day (QID) | RESPIRATORY_TRACT | 6 refills | Status: DC | PRN
Start: 2021-01-12 — End: 2021-01-12

## 2021-01-12 NOTE — Progress Notes (Signed)
Date:  01/12/2021   Name:  Anthony Bowman   DOB:  [birth year]   MRN:  [id number]   Chief Complaint: Establish Care and Asthma (Albuterol works better for him than [employer] account planner)  Patient is a 35 year old male who presents for a establish care exam. The patient reports the following problems: asthma. Health maintenance has been reviewed up to date.    Asthma He complains of wheezing. There is no chest tightness, cough, difficulty breathing, frequent throat clearing, hemoptysis, hoarse voice, shortness of breath or sputum production. This is a chronic problem. The current episode started more than 1 year ago. The problem occurs intermittently. The problem has been waxing and waning. Pertinent negatives include no appetite change, chest pain, dyspnea on exertion, ear pain, fever, headaches, heartburn, malaise/fatigue, myalgias, rhinorrhea, sneezing or sore throat. His past medical history is significant for asthma.   Lab Results  Component Value Date   CREATININE 0.98 05/05/2018   BUN 13 05/05/2018   NA 141 05/05/2018   K 3.8 05/05/2018   CL 101 05/05/2018   CO2 28 05/05/2018   No results found for: CHOL, HDL, LDLCALC, LDLDIRECT, TRIG, CHOLHDL No results found for: TSH No results found for: HGBA1C Lab Results  Component Value Date   WBC 15.2 (H) 05/05/2018   HGB 16.2 05/05/2018   HCT 47.2 05/05/2018   MCV 90.1 05/05/2018   PLT 228 05/05/2018   Lab Results  Component Value Date   ALT 27 05/05/2018   AST 25 05/05/2018   ALKPHOS 75 05/05/2018   BILITOT 1.1 05/05/2018     Review of Systems  Constitutional:  Negative for appetite change, chills, fever and malaise/fatigue.  HENT:  Negative for drooling, ear discharge, ear pain, hoarse voice, rhinorrhea, sneezing and sore throat.   Respiratory:  Positive for wheezing. Negative for cough, hemoptysis, sputum production and shortness of breath.   Cardiovascular:  Negative for chest pain, dyspnea on exertion, palpitations and leg  swelling.  Gastrointestinal:  Negative for abdominal pain, blood in stool, constipation, diarrhea, heartburn and nausea.  Endocrine: Negative for polydipsia.  Genitourinary:  Negative for dysuria, frequency, hematuria and urgency.  Musculoskeletal:  Negative for back pain, myalgias and neck pain.  Skin:  Negative for rash.  Allergic/Immunologic: Negative for environmental allergies.  Neurological:  Negative for dizziness and headaches.  Hematological:  Does not bruise/bleed easily.  Psychiatric/Behavioral:  Negative for suicidal ideas. The patient is not nervous/anxious.    Patient Active Problem List   Diagnosis Date Noted   Acute appendicitis 05/05/2018    Allergies  Allergen Reactions   No Known Allergies     Past Surgical History:  Procedure Laterality Date   APPENDECTOMY     LAPAROSCOPIC APPENDECTOMY N/A 05/05/2018   Procedure: APPENDECTOMY LAPAROSCOPIC;  Surgeon: Henrene Dodge, MD;  Location: [facility] ORS;  Service: General;  Laterality: N/A;   WISDOM TOOTH EXTRACTION     WOUND EXPLORATION Right 04/28/2016   Procedure: right hand and forearm WOUND EXPLORATION and muscle repair;  Surgeon: Bradly Bienenstock, MD;  Location: [facility] OR;  Service: Orthopedics;  Laterality: Right;    Social History   Tobacco Use   Smoking status: Some Days    Packs/day: 0.25    Pack years: 0.00    Types: Cigarettes   Smokeless tobacco: Former    Types: Associate Professor Use: Never used  Substance Use Topics   Alcohol use: Yes    Alcohol/week: 40.0 standard drinks    Types:  40 Cans of beer per week    Comment: 1 6 pack a day   Drug use: No     Medication list has been reviewed and updated.  Current Meds  Medication Sig   cetirizine (ZYRTEC) 10 MG tablet Take 10 mg by mouth as needed for allergies.   loratadine (CLARITIN) 10 MG tablet Take 10 mg by mouth as needed for allergies.    PHQ 2/9 Scores 01/12/2021 09/11/2015  PHQ - 2 Score 0 0  PHQ- 9 Score 2 -    GAD 7 : Generalized  Anxiety Score 01/12/2021  Nervous, Anxious, on Edge 0  Control/stop worrying 0  Worry too much - different things 0  Trouble relaxing 0  Restless 0  Easily annoyed or irritable 0  Afraid - awful might happen 0  Total GAD 7 Score 0    BP Readings from Last 3 Encounters:  01/12/21 130/70  05/17/18 121/72  05/08/18 (!) 151/98    Physical Exam Vitals and nursing note reviewed.  HENT:     Head: Normocephalic.     Right Ear: External ear normal.     Left Ear: External ear normal.     Nose: Nose normal.  Eyes:     General: No scleral icterus.       Right eye: No discharge.        Left eye: No discharge.     Conjunctiva/sclera: Conjunctivae normal.     Pupils: Pupils are equal, round, and reactive to light.  Neck:     Thyroid: No thyromegaly.     Vascular: No JVD.     Trachea: No tracheal deviation.  Cardiovascular:     Rate and Rhythm: Normal rate and regular rhythm.     Heart sounds: Normal heart sounds. No murmur heard.   No friction rub. No gallop.  Pulmonary:     Effort: No respiratory distress.     Breath sounds: Normal breath sounds. No wheezing or rales.  Abdominal:     General: Bowel sounds are normal.     Palpations: Abdomen is soft. There is no mass.     Tenderness: There is no abdominal tenderness. There is no guarding or rebound.  Musculoskeletal:        General: No tenderness. Normal range of motion.     Cervical back: Normal range of motion and neck supple.  Lymphadenopathy:     Cervical: No cervical adenopathy.  Skin:    General: Skin is warm.     Findings: No rash. Bruising: est care. Neurological:     Mental Status: He is alert and oriented to person, place, and time.     Cranial Nerves: No cranial nerve deficit.     Deep Tendon Reflexes: Reflexes are normal and symmetric.    Wt Readings from Last 3 Encounters:  01/12/21 159 lb (72.1 kg)  05/17/18 149 lb 3.2 oz (67.7 kg)  05/08/18 148 lb (67.1 kg)    BP 130/70   Pulse 76   Ht 6' (1.829 m)    Wt 159 lb (72.1 kg)   BMI 21.56 kg/m   Assessment and Plan:  1. Encounter for medical examination to establish care In establishing care with new physician.  Patient has no concerns other than needing refills on his albuterol inhaler.  2. Mild intermittent asthma, unspecified whether complicated Mild.  Intermittent.  Relatively stable with only needing to use during the hot weather work on an as-needed basis albuterol inhaler 2 puffs every 6 hours.  I have informed patient if he does get a nebulizer of the protocol and nebulized solutions of albuterol as well. - albuterol (VENTOLIN HFA) 108 (90 Base) MCG/ACT inhaler; Inhale 2 puffs into the lungs every 6 (six) hours as needed for wheezing or shortness of breath.  Dispense: 8 g; Refill: 11

## 2022-11-29 ENCOUNTER — Other Ambulatory Visit: Payer: Self-pay

## 2022-11-29 ENCOUNTER — Emergency Department
Admission: EM | Admit: 2022-11-29 | Discharge: 2022-11-29 | Disposition: A | Discharge status: Home / Self Care | Payer: Self-pay | Attending: Emergency Medicine | Admitting: Emergency Medicine

## 2022-11-29 DIAGNOSIS — M5416 Radiculopathy, lumbar region: Secondary | ICD-10-CM | POA: Insufficient documentation

## 2022-11-29 MED ORDER — KETOROLAC TROMETHAMINE 15 MG/ML IJ SOLN
15.0000 mg | Freq: Once | INTRAMUSCULAR | Status: AC
  Administered 2022-11-29: 15 mg via INTRAMUSCULAR
  Filled 2022-11-29: qty 1

## 2022-11-29 MED ORDER — MORPHINE SULFATE (PF) 4 MG/ML IV SOLN
5.0000 mg | Freq: Once | INTRAVENOUS | Status: AC
  Administered 2022-11-29: 5 mg via INTRAMUSCULAR
  Filled 2022-11-29: qty 2

## 2022-11-29 MED ORDER — NAPROXEN 500 MG PO TABS: 500.0000 mg | ORAL_TABLET | Freq: Two times a day (BID) | ORAL | 0 refills | Status: AC

## 2022-11-29 MED ORDER — LIDOCAINE 5 % EX PTCH: 1.0000 | MEDICATED_PATCH | Freq: Two times a day (BID) | CUTANEOUS | 0 refills | Status: AC

## 2022-11-29 MED ORDER — PREDNISONE 10 MG (21) PO TBPK: ORAL_TABLET | ORAL | 0 refills | Status: AC

## 2022-11-29 MED ORDER — LIDOCAINE 5 % EX PTCH
1.0000 | MEDICATED_PATCH | CUTANEOUS | Status: DC
  Administered 2022-11-29: 1 via TRANSDERMAL
  Filled 2022-11-29: qty 1

## 2022-11-29 MED ORDER — ACETAMINOPHEN 325 MG PO TABS
650.0000 mg | ORAL_TABLET | Freq: Once | ORAL | Status: AC
  Administered 2022-11-29: 650 mg via ORAL
  Filled 2022-11-29: qty 2

## 2022-11-29 MED ORDER — DEXAMETHASONE SODIUM PHOSPHATE 10 MG/ML IJ SOLN
10.0000 mg | Freq: Once | INTRAMUSCULAR | Status: AC
  Administered 2022-11-29: 10 mg via INTRAMUSCULAR
  Filled 2022-11-29: qty 1

## 2022-11-29 NOTE — Discharge Instructions (Signed)
Please return to the emergency department for any new, worsening, or changing symptoms or other concerns including weakness in your legs, urinary or stool incontinence or retention, numbness or tingling in your extremities/buttocks/groin, fevers, or any other concerns or change in symptoms.  You may follow-up with the provider listed to ensure that this improves as to be expected.  Please take the medications as prescribed.  It was a pleasure caring for you today.

## 2022-11-29 NOTE — ED Triage Notes (Signed)
Pt to ED for left sided back pain started yesterday. Reports pain radiates down leg. Denies injuries.

## 2022-11-29 NOTE — ED Provider Notes (Signed)
[facility] Provider Note    Event Date/Time   First MD Initiated Contact with Patient 11/29/22 1346     (approximate)   History   Back Pain   HPI  Anthony Bowman is a 37 y.o. male who presents today for evaluation of left-sided back pain that radiates down his left leg.  Patient reports that he works in [employer] and has had back pain for a long time.  He reports that he was running after a 41-year-old yesterday and felt like this tweaked his back further.  He reports that the pain radiates down the left leg.  He denies weakness in his leg.  He denies urinary fecal incontinence or retention.  He denies history of IV drug use, no fevers.  He denies abdominal pain.  He used hot and cold compresses overnight but does not feel like this took care of the problem so he came in for further evaluation.  He is with his girlfriend.  Patient Active Problem List   Diagnosis Date Noted   Acute appendicitis 05/05/2018          Physical Exam   Triage Vital Signs: ED Triage Vitals  Enc Vitals Group     BP 11/29/22 1240 (!) 152/81     Pulse Rate 11/29/22 1240 87     Resp 11/29/22 1240 16     Temp 11/29/22 1240 98.5 F (36.9 C)     Temp src --      SpO2 11/29/22 1240 98 %     Weight 11/29/22 1240 155 lb (70.3 kg)     Height 11/29/22 1240 6' (1.829 m)     Head Circumference --      Peak Flow --      Pain Score 11/29/22 1241 8     Pain Loc --      Pain Edu? --      Excl. in GC? --     Most recent vital signs: Vitals:   11/29/22 1240  BP: (!) 152/81  Pulse: 87  Resp: 16  Temp: 98.5 F (36.9 C)  SpO2: 98%    Physical Exam Vitals and nursing note reviewed.  Constitutional:      General: Awake and alert. No acute distress.    Appearance: Normal appearance. The patient is normal weight.  HENT:     Head: Normocephalic and atraumatic.     Mouth: Mucous membranes are moist.  Eyes:     General: PERRL. Normal EOMs        Right eye: No discharge.         Left eye: No discharge.     Conjunctiva/sclera: Conjunctivae normal.  Cardiovascular:     Rate and Rhythm: Normal rate and regular rhythm.     Pulses: Normal pulses.  Pulmonary:     Effort: Pulmonary effort is normal. No respiratory distress.     Breath sounds: Normal breath sounds.  Abdominal:     Abdomen is soft. There is no abdominal tenderness. No rebound or guarding. No distention. Musculoskeletal:        General: No swelling. Normal range of motion.     Cervical back: Normal range of motion and neck supple.  Back: No midline tenderness.  Tenderness to left lumbar area.  No overlying skin changes.  Strength and sensation 5/5 to bilateral lower extremities. Normal great toe extension against resistance. Normal sensation throughout feet. Normal patellar reflexes. Positive SLR on the left and positive opposite SLR on same side.  Negative FABER test Skin:    General: Skin is warm and dry.     Capillary Refill: Capillary refill takes less than 2 seconds.     Findings: No rash.  Neurological:     Mental Status: The patient is awake and alert.      ED Results / Procedures / Treatments   Labs (all labs ordered are listed, but only abnormal results are displayed) Labs Reviewed - No data to display   EKG     RADIOLOGY     PROCEDURES:  Critical Care performed:   Procedures   MEDICATIONS ORDERED IN ED: Medications  lidocaine (LIDODERM) 5 % 1 patch (1 patch Transdermal Patch Applied 11/29/22 1438)  ketorolac (TORADOL) 15 MG/ML injection 15 mg (15 mg Intramuscular Given 11/29/22 1440)  dexamethasone (DECADRON) injection 10 mg (10 mg Intramuscular Given 11/29/22 1442)  morphine (PF) 4 MG/ML injection 5 mg (5 mg Intramuscular Given 11/29/22 1441)  acetaminophen (TYLENOL) tablet 650 mg (650 mg Oral Given 11/29/22 1439)     IMPRESSION / MDM / ASSESSMENT AND PLAN / ED COURSE  I reviewed the triage vital signs and the nursing notes.   Differential diagnosis includes, but  is not limited to, lumbar radiculopathy, muscle spasm, muscle strain.  Patient is awake and alert, hemodynamically stable and afebrile.  He is nontoxic in appearance.  Patient has 5 out of 5 strength with intact sensation to extensor hallucis dorsiflexion and plantarflexion of bilateral lower extremities with normal patellar reflexes bilaterally. Most likely etiology at this point is muscle strain vs herniated disc. No red flags to indicate patient is at risk for more auspicious process that would require urgent/emergent spinal imaging or subspecialty evaluation at this time. No major trauma, no midline tenderness, no history or physical exam findings to suggest cauda equina syndrome or spinal cord compression. No focal neurological deficits on exam. No constitutional symptoms or history of immunosuppression or IVDA to suggest potential for epidural abscess. Not anticoagulated, no history of bleeding diastasis to suggest risk for epidural hematoma. No chronic steroid use or advanced age or history of malignancy to suggest proclivity towards pathological fracture.  No abdominal pain or flank pain to suggest kidney stone, no history of kidney stone.  No fever or dysuria or CVAT to suggest pyelonephritis .  No chest pain, back pain, shortness of breath, neurological deficits, to suggest vascular catastrophe, and pulses are equal in all 4 extremities.  Discussed care instructions and return precautions with patient. Recommended close outpatient follow-up for re-evaluation. Patient agrees with plan of care. Will treat the patient symptomatically as needed for pain control. Will discharge patient to take these medications and return for any worsening or different pain or development of any neurologic symptoms. Educated patient regarding expected time course for back pain to improve and recommended very close outpatient follow-up.  Patient understands and agrees with plan.  He was discharged in stable condition with  his girlfriend who is driving.    Patient's presentation is most consistent with acute complicated illness / injury requiring diagnostic workup.    FINAL CLINICAL IMPRESSION(S) / ED DIAGNOSES   Final diagnoses:  Lumbar radiculopathy     Rx / DC Orders   ED Discharge Orders          Ordered    predniSONE (STERAPRED UNI-PAK 21 TAB) 10 MG (21) TBPK tablet        11/29/22 1427    naproxen (NAPROSYN) 500 MG tablet  2 times daily with meals  11/29/22 1427    lidocaine (LIDODERM) 5 %  Every 12 hours        11/29/22 1427             Note:  This document was prepared using Dragon voice recognition software and may include unintentional dictation errors.   Keturah Shavers 11/29/22 1454    Jene Every, MD 11/29/22 1521

## 2022-11-30 ENCOUNTER — Telehealth: Payer: Self-pay

## 2022-11-30 NOTE — Transitions of Care (Post Inpatient/ED Visit) (Signed)
   11/30/2022  Name: CLENTON JARBO MRN: [id number] DOB: 11-Oct-1985  Today's TOC FU Call Status: Today's TOC FU Call Status:: Unsuccessul Call (1st Attempt) Unsuccessful Call (1st Attempt) Date: 11/30/22  Attempted to reach the patient regarding the most recent Inpatient/ED visit.  Follow Up Plan: Additional outreach attempts will be made to reach the patient to complete the Transitions of Care (Post Inpatient/ED visit) call.   Signature Arthur Holms

## 2022-11-30 NOTE — Transitions of Care (Post Inpatient/ED Visit) (Signed)
   11/30/2022  Name: Anthony Bowman MRN: [id number] DOB: Apr 25, 1986  Today's TOC FU Call Status: Today's TOC FU Call Status:: Unsuccessful Call (2nd Attempt) Unsuccessful Call (2nd Attempt) Date: 11/30/22  Attempted to reach the patient regarding the most recent Inpatient/ED visit.  Follow Up Plan: Additional outreach attempts will be made to reach the patient to complete the Transitions of Care (Post Inpatient/ED visit) call.   Signature Arthur Holms

## 2022-11-30 NOTE — Telephone Encounter (Signed)
Pt did not answer the phone for [facility] call

## 2022-11-30 NOTE — Telephone Encounter (Signed)
Created in error, pt has not been seen since 2022.

## 2022-12-01 ENCOUNTER — Telehealth: Payer: Self-pay

## 2022-12-01 NOTE — Transitions of Care (Post Inpatient/ED Visit) (Signed)
   12/01/2022  Name: Anthony Bowman MRN: [id number] DOB: 05/05/1986  Today's TOC FU Call Status: Today's TOC FU Call Status:: Unsuccessful Call (3rd Attempt) Unsuccessful Call (3rd Attempt) Date: 12/01/22  Attempted to reach the patient regarding the most recent Inpatient/ED visit.  Follow Up Plan: No further outreach attempts will be made at this time. We have been unable to contact the patient.  Signature Arthur Holms
